# Patient Record
Sex: Female | Born: 1955 | Race: White | Hispanic: No | Marital: Married | Smoking: Never smoker
Health system: Southern US, Community
[De-identification: ages and names within clinical notes are randomized; demographics above are authoritative.]

## PROBLEM LIST (undated history)

## (undated) DIAGNOSIS — U071 COVID-19: Secondary | ICD-10-CM

## (undated) DIAGNOSIS — I10 Essential (primary) hypertension: Secondary | ICD-10-CM

## (undated) DIAGNOSIS — G8929 Other chronic pain: Secondary | ICD-10-CM

## (undated) DIAGNOSIS — M545 Low back pain, unspecified: Secondary | ICD-10-CM

## (undated) HISTORY — DX: Low back pain: M54.5

## (undated) HISTORY — DX: Essential (primary) hypertension: I10

## (undated) HISTORY — DX: Other chronic pain: G89.29

## (undated) HISTORY — DX: Low back pain, unspecified: M54.50

---

## 1999-02-24 ENCOUNTER — Encounter: Payer: Self-pay | Admitting: *Deleted

## 1999-02-24 ENCOUNTER — Ambulatory Visit (HOSPITAL_COMMUNITY): Admission: RE | Admit: 1999-02-24 | Discharge: 1999-02-24 | Payer: Self-pay | Admitting: *Deleted

## 2003-05-23 ENCOUNTER — Other Ambulatory Visit: Admission: RE | Admit: 2003-05-23 | Discharge: 2003-05-23 | Payer: Self-pay | Admitting: Family Medicine

## 2004-04-15 ENCOUNTER — Ambulatory Visit (HOSPITAL_COMMUNITY): Admission: RE | Admit: 2004-04-15 | Discharge: 2004-04-15 | Payer: Self-pay | Admitting: Family Medicine

## 2004-04-17 ENCOUNTER — Encounter: Admission: RE | Admit: 2004-04-17 | Discharge: 2004-04-17 | Payer: Self-pay | Admitting: Family Medicine

## 2005-04-14 ENCOUNTER — Other Ambulatory Visit: Admission: RE | Admit: 2005-04-14 | Discharge: 2005-04-14 | Payer: Self-pay | Admitting: Obstetrics and Gynecology

## 2005-06-30 ENCOUNTER — Encounter: Admission: RE | Admit: 2005-06-30 | Discharge: 2005-06-30 | Payer: Self-pay | Admitting: Obstetrics and Gynecology

## 2006-04-10 IMAGING — CR DG SHOULDER 2+V*R*
3 series · 3 of 3 positions shown · non-contrast
Comparison: None.
COMPARISON: None.

CLINICAL DATA: Right shoulder pain and right arm numbness ? No known injury. 
 CERVICAL SPINE COMPLETE ? 5 VIEWS:

[w shoulder ap internal righ *]
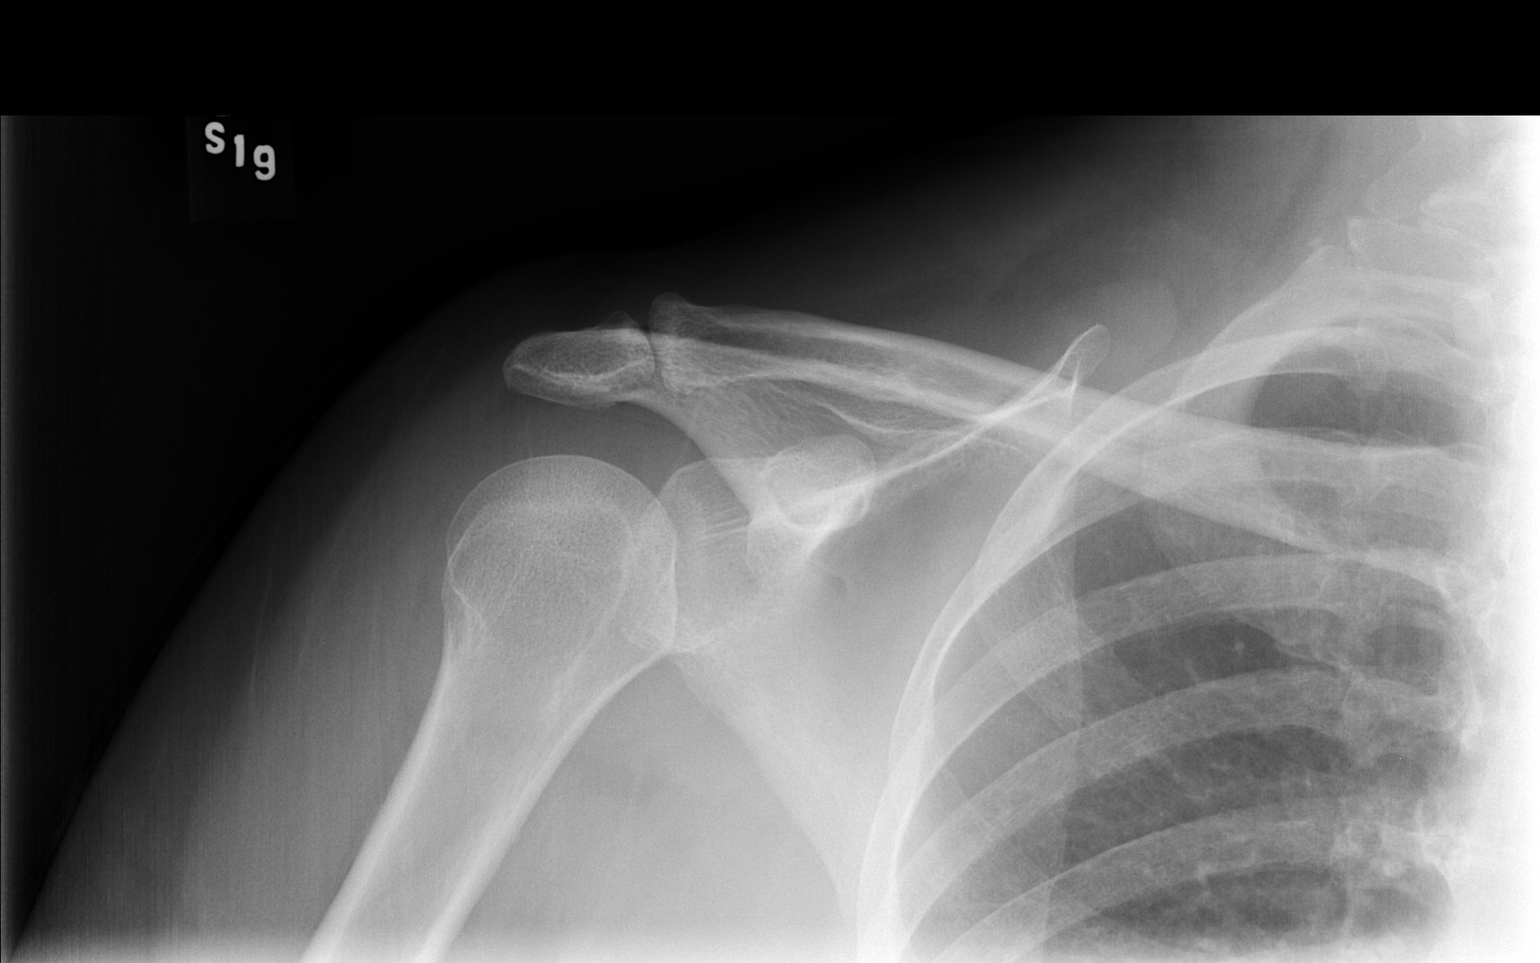

[w shoulder ap external righ *]
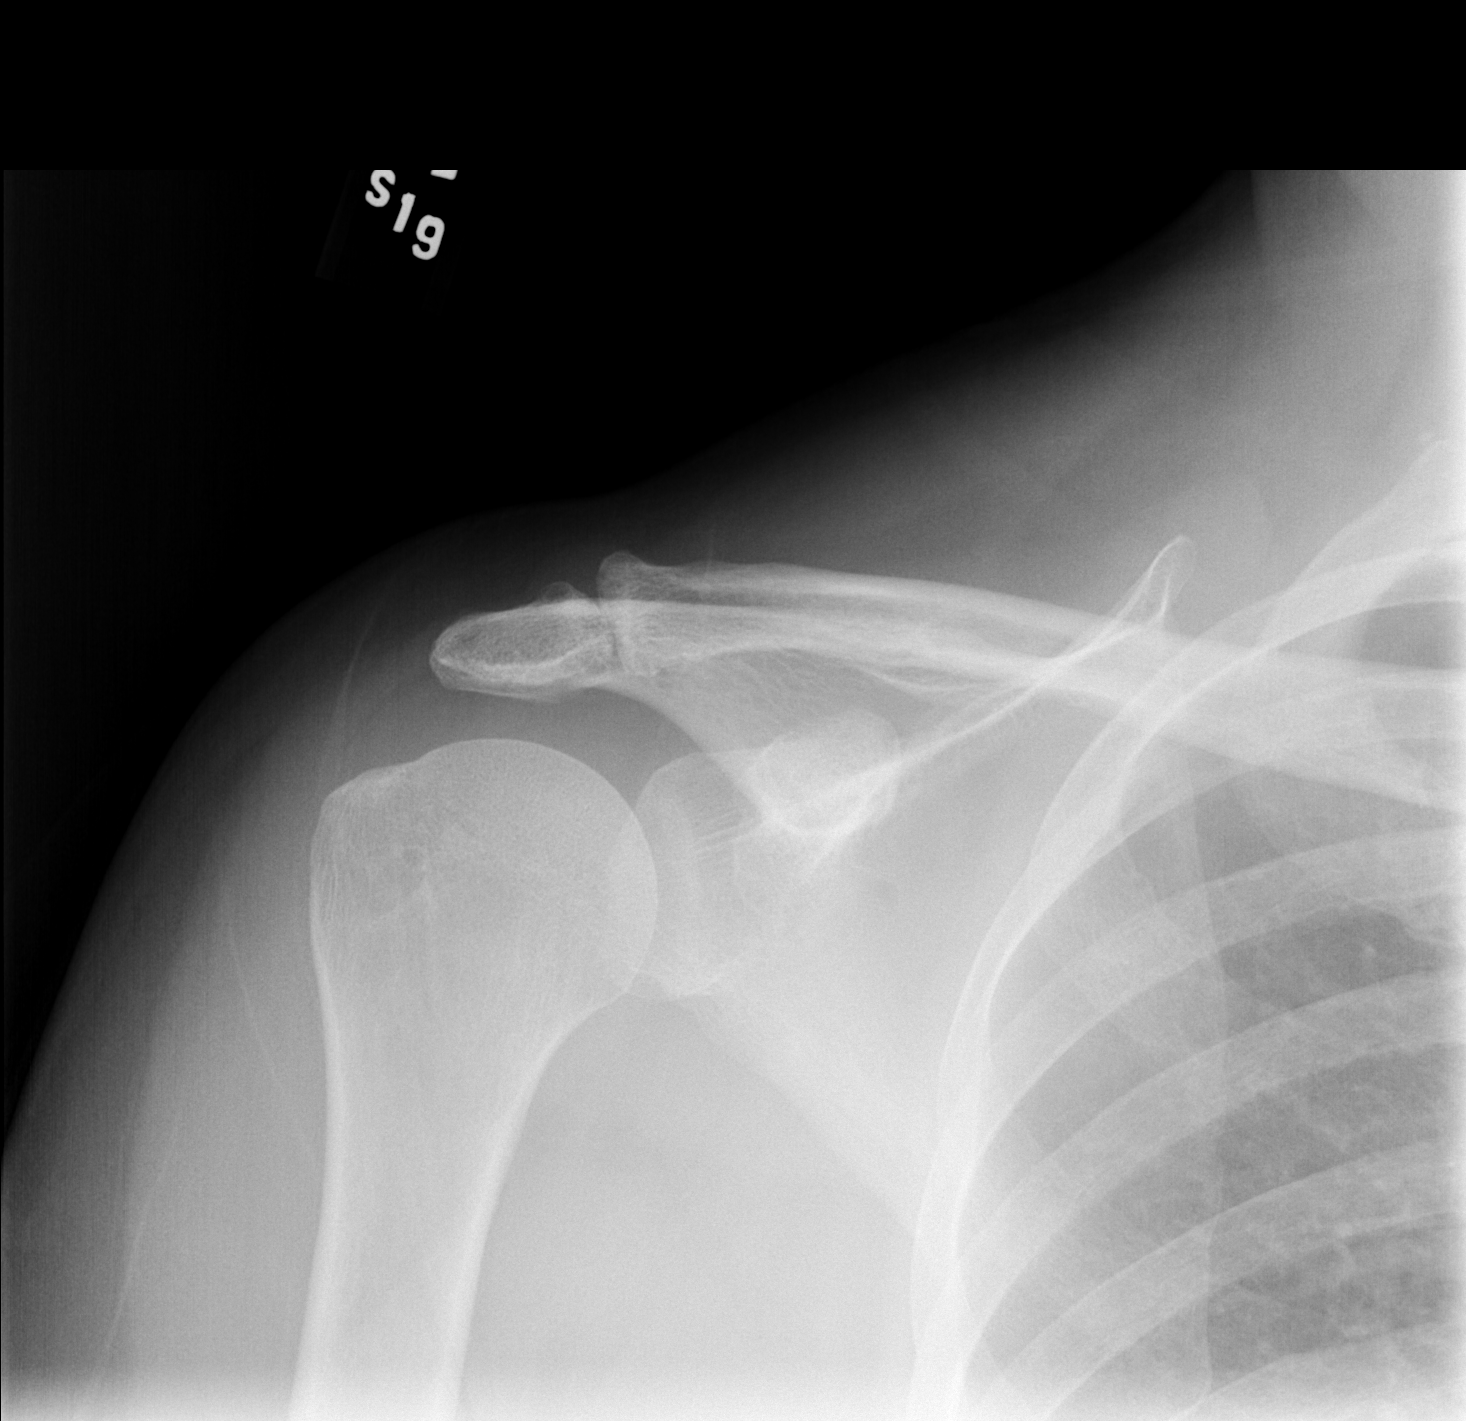

[w shoulder y view right *]
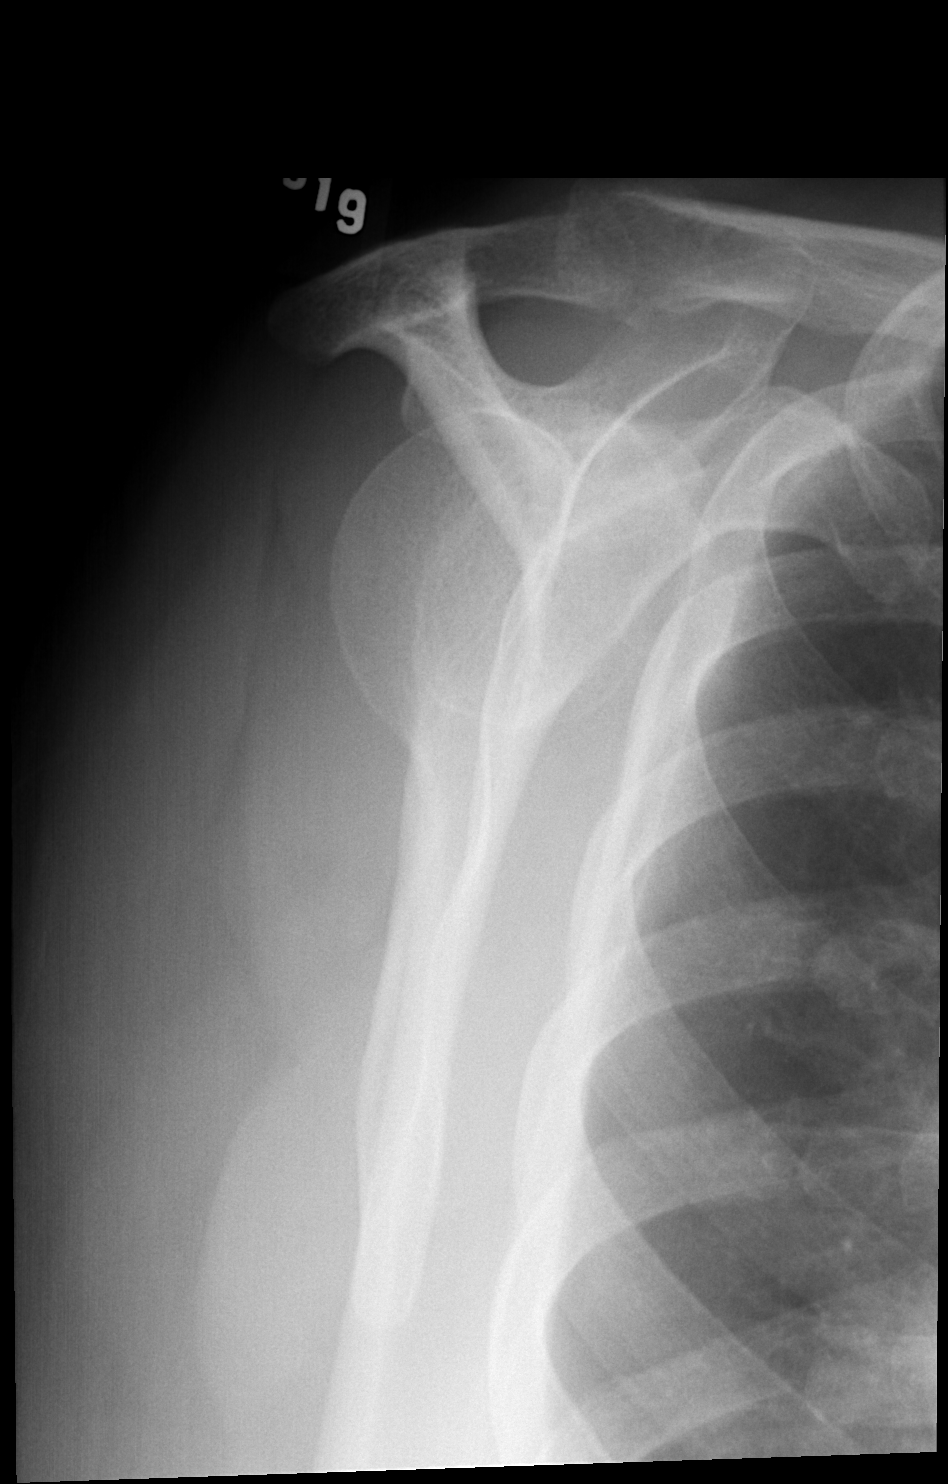

[3 of 3 positions shown; findings below may reference images not displayed]

FINDINGS: A routine four view study plus a lateral swimmer?s view of the cervicothoracic junction were done.
 There is disk narrowing at C6-7 and to a lesser degree at C5-6.  Mild biforaminal narrowing is noted at C5-6 and C6-7.  No acute changes.  Soft tissues are normal.
IMPRESSION: Degenerative disk disease at C5-6 and C6-7 ? no acute findings.
 RIGHT SHOULDER ? 3 VIEWS:
FINDINGS: Moderate degenerative changes of the acromioclavicular joints.  No acute bony abnormality.  No soft tissue calcifications.
IMPRESSION: Moderate degenerative changes of the right A/C joint ? no acute findings.

## 2010-02-21 ENCOUNTER — Encounter: Payer: Self-pay | Admitting: Family Medicine

## 2010-06-01 DEATH — deceased

## 2013-07-15 ENCOUNTER — Other Ambulatory Visit: Payer: Self-pay | Admitting: Orthopedic Surgery

## 2013-07-15 DIAGNOSIS — M25569 Pain in unspecified knee: Secondary | ICD-10-CM

## 2013-07-21 ENCOUNTER — Ambulatory Visit
Admission: RE | Admit: 2013-07-21 | Discharge: 2013-07-21 | Disposition: A | Discharge status: Home / Self Care | Payer: BC Managed Care – PPO | Source: Ambulatory Visit | Attending: Orthopedic Surgery | Admitting: Orthopedic Surgery

## 2013-07-21 DIAGNOSIS — M25569 Pain in unspecified knee: Secondary | ICD-10-CM

## 2013-12-03 ENCOUNTER — Other Ambulatory Visit: Payer: Self-pay | Admitting: Orthopedic Surgery

## 2013-12-03 DIAGNOSIS — M25561 Pain in right knee: Secondary | ICD-10-CM

## 2013-12-12 ENCOUNTER — Ambulatory Visit
Admission: RE | Admit: 2013-12-12 | Discharge: 2013-12-12 | Disposition: A | Discharge status: Home / Self Care | Payer: BC Managed Care – PPO | Source: Ambulatory Visit | Attending: Orthopedic Surgery | Admitting: Orthopedic Surgery

## 2013-12-12 DIAGNOSIS — M25561 Pain in right knee: Secondary | ICD-10-CM

## 2014-07-24 ENCOUNTER — Encounter (HOSPITAL_COMMUNITY): Payer: Self-pay | Admitting: *Deleted

## 2014-07-24 ENCOUNTER — Emergency Department (HOSPITAL_COMMUNITY)
Admission: EM | Admit: 2014-07-24 | Discharge: 2014-07-24 | Disposition: A | Discharge status: Home / Self Care | Payer: BLUE CROSS/BLUE SHIELD | Attending: Emergency Medicine | Admitting: Emergency Medicine

## 2014-07-24 DIAGNOSIS — D72829 Elevated white blood cell count, unspecified: Secondary | ICD-10-CM | POA: Diagnosis not present

## 2014-07-24 DIAGNOSIS — Z79899 Other long term (current) drug therapy: Secondary | ICD-10-CM | POA: Insufficient documentation

## 2014-07-24 DIAGNOSIS — L500 Allergic urticaria: Secondary | ICD-10-CM | POA: Insufficient documentation

## 2014-07-24 DIAGNOSIS — Y92007 Garden or yard of unspecified non-institutional (private) residence as the place of occurrence of the external cause: Secondary | ICD-10-CM | POA: Insufficient documentation

## 2014-07-24 DIAGNOSIS — T7840XA Allergy, unspecified, initial encounter: Secondary | ICD-10-CM | POA: Insufficient documentation

## 2014-07-24 DIAGNOSIS — Y998 Other external cause status: Secondary | ICD-10-CM | POA: Insufficient documentation

## 2014-07-24 DIAGNOSIS — S300XXA Contusion of lower back and pelvis, initial encounter: Secondary | ICD-10-CM | POA: Diagnosis not present

## 2014-07-24 DIAGNOSIS — R55 Syncope and collapse: Secondary | ICD-10-CM | POA: Diagnosis not present

## 2014-07-24 DIAGNOSIS — X58XXXA Exposure to other specified factors, initial encounter: Secondary | ICD-10-CM | POA: Diagnosis not present

## 2014-07-24 DIAGNOSIS — R21 Rash and other nonspecific skin eruption: Secondary | ICD-10-CM | POA: Diagnosis present

## 2014-07-24 DIAGNOSIS — Y9389 Activity, other specified: Secondary | ICD-10-CM | POA: Insufficient documentation

## 2014-07-24 LAB — COMPREHENSIVE METABOLIC PANEL
ALBUMIN: 3.7 g/dL (ref 3.5–5.0)
ALT: 24 U/L (ref 14–54)
ANION GAP: 10 (ref 5–15)
AST: 29 U/L (ref 15–41)
Alkaline Phosphatase: 93 U/L (ref 38–126)
BILIRUBIN TOTAL: 0.4 mg/dL (ref 0.3–1.2)
BUN: 10 mg/dL (ref 6–20)
CALCIUM: 8.6 mg/dL — AB (ref 8.9–10.3)
CHLORIDE: 97 mmol/L — AB (ref 101–111)
CO2: 24 mmol/L (ref 22–32)
CREATININE: 0.7 mg/dL (ref 0.44–1.00)
GFR calc Af Amer: >60 mL/min (ref >60)
GFR calc non Af Amer: >60 mL/min (ref >60)
Glucose, Bld: 151 mg/dL — ABNORMAL HIGH (ref 65–99)
Potassium: 3.3 mmol/L — ABNORMAL LOW (ref 3.5–5.1)
Sodium: 131 mmol/L — ABNORMAL LOW (ref 135–145)
TOTAL PROTEIN: 6.5 g/dL (ref 6.5–8.1)

## 2014-07-24 LAB — URINALYSIS, ROUTINE W REFLEX MICROSCOPIC
BILIRUBIN URINE: NEGATIVE
GLUCOSE, UA: NEGATIVE mg/dL
HGB URINE DIPSTICK: NEGATIVE
KETONES UR: NEGATIVE mg/dL
Nitrite: NEGATIVE
PROTEIN: NEGATIVE mg/dL
Specific Gravity, Urine: 1.005 (ref 1.005–1.030)
UROBILINOGEN UA: 0.2 mg/dL (ref 0.0–1.0)
pH: 6 (ref 5.0–8.0)

## 2014-07-24 LAB — CBC WITH DIFFERENTIAL/PLATELET
BASOS ABS: 0 10*3/uL (ref 0.0–0.1)
BASOS PCT: 0 % (ref 0–1)
EOS PCT: 0 % (ref 0–5)
Eosinophils Absolute: 0 10*3/uL (ref 0.0–0.7)
HEMATOCRIT: 40.5 % (ref 36.0–46.0)
Hemoglobin: 13.6 g/dL (ref 12.0–15.0)
Lymphocytes Relative: 11 % — ABNORMAL LOW (ref 12–46)
Lymphs Abs: 2.1 10*3/uL (ref 0.7–4.0)
MCH: 29.5 pg (ref 26.0–34.0)
MCHC: 33.6 g/dL (ref 30.0–36.0)
MCV: 87.9 fL (ref 78.0–100.0)
MONO ABS: 1 10*3/uL (ref 0.1–1.0)
Monocytes Relative: 5 % (ref 3–12)
Neutro Abs: 16 10*3/uL — ABNORMAL HIGH (ref 1.7–7.7)
Neutrophils Relative %: 84 % — ABNORMAL HIGH (ref 43–77)
Platelets: 342 10*3/uL (ref 150–400)
RBC: 4.61 MIL/uL (ref 3.87–5.11)
RDW: 12.7 % (ref 11.5–15.5)
WBC: 19.1 10*3/uL — ABNORMAL HIGH (ref 4.0–10.5)

## 2014-07-24 LAB — URINE MICROSCOPIC-ADD ON

## 2014-07-24 LAB — I-STAT TROPONIN, ED: Troponin i, poc: 0.01 ng/mL (ref 0.00–0.08)

## 2014-07-24 LAB — I-STAT CG4 LACTIC ACID, ED: Lactic Acid, Venous: 2.25 mmol/L (ref 0.5–2.0)

## 2014-07-24 MED ORDER — SODIUM CHLORIDE 0.9 % IV BOLUS (SEPSIS)
1000.0000 mL | Freq: Once | INTRAVENOUS | Status: AC
  Administered 2014-07-24: 1000 mL via INTRAVENOUS

## 2014-07-24 NOTE — Discharge Instructions (Signed)
Stay hydrated.   Take benadryl 25 mg every 6 hrs for itchiness.  Be careful that you will be drowsy after benadryl and may fall or pass out.   Follow up with your doctor.   Your white blood cell is elevated. Recheck in a week.   Return to ER if you have fever, worse rash, throat closing, trouble breathing, chest pain, back pain.

## 2014-07-24 NOTE — ED Notes (Signed)
Patient is resting comfortably. 

## 2014-07-24 NOTE — ED Notes (Signed)
Pt had syncopal episode while showering approx ago.  EMS reports pt has had hives and swelling in feet and hands since 2330 last evening - pt had been working in the garden and stated she thinks she was bitten by something.  Pt took 25mg  of benadryl PO and was given benadryl IV by EMS.  Pt denies SHOB, wheezing, CP.

## 2014-07-24 NOTE — ED Provider Notes (Signed)
CSN: [id number]     Arrival date & time 07/24/14  0116 History   First MD Initiated Contact with Patient 07/24/14 0124     Chief Complaint  Patient presents with  . Loss of Consciousness     (Consider location/radiation/quality/duration/timing/severity/associated sxs/prior Treatment) The history is provided by the patient.  Tiffany Soto is a 59 y.o. female history hypertension here presenting with possible allergic reaction, syncope. Patient was in the garden earlier today and states that she may have been bitten by something. She did take 25 mg of Benadryl. She then took a shower and then felt lightheaded dizzy and passed out and hit the faucet on the left side of her chest. Denies any head injury. She was noticed to have diffuse rash so given IV Benadryl by EMS. Denies any cardiac history and denies any early cardiac death in the family.     History reviewed. No pertinent past medical history. History reviewed. No pertinent past surgical history. No family history on file. History  Substance Use Topics  . Smoking status: Not on file  . Smokeless tobacco: Not on file  . Alcohol Use: Not on file   OB History    No data available     Review of Systems  Cardiovascular: Positive for syncope.  Skin: Positive for rash.  Neurological: Positive for syncope.  All other systems reviewed and are negative.     Allergies  Review of patient's allergies indicates no known allergies.  Home Medications   Prior to Admission medications   Medication Sig Start Date End Date Taking? Authorizing Provider  glucosamine-chondroitin 500-400 MG tablet Take 1 tablet by mouth daily.   Yes Historical Provider, MD  LISINOPRIL-HYDROCHLOROTHIAZIDE PO Take 1 tablet by mouth daily.   Yes Historical Provider, MD  Omega-3 Fatty Acids (FISH OIL PO) Take 1 tablet by mouth daily.   Yes Historical Provider, MD  vitamin B-12 (CYANOCOBALAMIN) 1000 MCG tablet Take 1,000 mcg by mouth daily.   Yes Historical  Provider, MD   BP 115/59 mmHg  Pulse 79  Temp(Src) 97.5 F (36.4 C) (Oral)  Resp 22  SpO2 97% Physical Exam  Constitutional: She is oriented to person, place, and time. She appears well-nourished.  Tired   HENT:  Head: Normocephalic.  MM slightly dry   Eyes: Conjunctivae are normal. Pupils are equal, round, and reactive to light.  Neck: Normal range of motion. Neck supple.  Cardiovascular: Normal rate and regular rhythm.   Pulmonary/Chest: Effort normal and breath sounds normal. No respiratory distress. She has no wheezes. She has no rales.  Bruise L side of back, nontender, no crepitance   Abdominal: Soft. Bowel sounds are normal. She exhibits no distension. There is no tenderness. There is no rebound.  Musculoskeletal: Normal range of motion. She exhibits no edema or tenderness.  Neurological: She is alert and oriented to person, place, and time.  Skin:  Faint urticaria on arm, torso.   Psychiatric: She has a normal mood and affect. Her behavior is normal. Judgment and thought content normal.  Nursing note and vitals reviewed.   ED Course  Procedures (including critical care time) Labs Review Labs Reviewed  CBC WITH DIFFERENTIAL/PLATELET - Abnormal; Notable for the following:    WBC 19.1 (*)    Neutrophils Relative % 84 (*)    Neutro Abs 16.0 (*)    Lymphocytes Relative 11 (*)    All other components within normal limits  COMPREHENSIVE METABOLIC PANEL - Abnormal; Notable for the following:  Sodium 131 (*)    Potassium 3.3 (*)    Chloride 97 (*)    Glucose, Bld 151 (*)    Calcium 8.6 (*)    All other components within normal limits  URINALYSIS, ROUTINE W REFLEX MICROSCOPIC (NOT AT [facility]) - Abnormal; Notable for the following:    Leukocytes, UA SMALL (*)    All other components within normal limits  I-STAT CG4 LACTIC ACID, ED - Abnormal; Notable for the following:    Lactic Acid, Venous 2.25 (*)    All other components within normal limits  URINE MICROSCOPIC-ADD ON   I-STAT TROPOININ, ED    Imaging Review No results found.   EKG Interpretation   Date/Time:  Thursday July 24 2014 01:41:13 EDT Ventricular Rate:  84 PR Interval:  147 QRS Duration: 98 QT Interval:  395 QTC Calculation: 467 R Axis:   11 Text Interpretation:  Sinus rhythm Low voltage, precordial leads  Borderline T abnormalities, anterior leads No previous ECGs available  Confirmed by Krisna Omar  MD, Quatavious Rossa ([id number]) on 07/24/2014 1:50:14 AM      MDM   Final diagnoses:  None    Tiffany Soto is a 59 y.o. female here with syncope, possible allergic reaction. She has a faint urticaria now, much improved per patient after benadryl. Syncope likely drowsiness after benadryl. Patient not orthostatic in the ED. Given IVF. Electrolytes at baseline. WBC 19, lactate 2.25, UA nl. No signs of sepsis. I think likely allergic reaction vs stress from falling. The bruise on L back is nontender, patient refused rib series but I doubt fracture. Ambulated in the ED and felt better. Will dc home.     Richardean Canal, MD 07/24/14 [phone]

## 2014-07-24 NOTE — ED Notes (Signed)
Bed: WA04 Expected date: 07/24/14 Expected time: 12:57 AM Means of arrival: Ambulance Comments: Syncopal episode

## 2014-07-24 NOTE — ED Notes (Signed)
Notified EDP,Yao,MD. Pt. i-stat lactic acid results 2.25.

## 2015-09-10 ENCOUNTER — Ambulatory Visit
Admission: RE | Admit: 2015-09-10 | Discharge: 2015-09-10 | Disposition: A | Discharge status: Home / Self Care | Source: Ambulatory Visit | Attending: Physician Assistant | Admitting: Physician Assistant

## 2015-09-10 ENCOUNTER — Other Ambulatory Visit: Payer: Self-pay | Admitting: Physician Assistant

## 2015-09-10 DIAGNOSIS — R059 Cough, unspecified: Secondary | ICD-10-CM

## 2015-09-10 DIAGNOSIS — R05 Cough: Secondary | ICD-10-CM

## 2015-09-29 ENCOUNTER — Ambulatory Visit (INDEPENDENT_AMBULATORY_CARE_PROVIDER_SITE_OTHER): Admitting: Allergy and Immunology

## 2015-09-29 ENCOUNTER — Encounter: Payer: Self-pay | Admitting: Allergy and Immunology

## 2015-09-29 VITALS — BP 118/80 | HR 84 | Resp 16

## 2015-09-29 DIAGNOSIS — J310 Chronic rhinitis: Secondary | ICD-10-CM | POA: Insufficient documentation

## 2015-09-29 DIAGNOSIS — R062 Wheezing: Secondary | ICD-10-CM | POA: Diagnosis not present

## 2015-09-29 DIAGNOSIS — Z889 Allergy status to unspecified drugs, medicaments and biological substances status: Secondary | ICD-10-CM | POA: Diagnosis not present

## 2015-09-29 MED ORDER — EPINEPHRINE 0.3 MG/0.3ML IJ SOAJ: INTRAMUSCULAR | 1 refills | Status: DC

## 2015-09-29 NOTE — Progress Notes (Signed)
    Follow-up Note  RE: Tiffany Soto MRN: [id number] DOB: 11/08/1955 Date of Office Visit: 09/29/2015  Primary care provider: No primary care provider on file. Referring provider: No ref. provider found  History of present illness: Tiffany Soto is a 60 y.o. 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  She was last seen in this clinic in September 2016.  She has had no additional allergic reactions in the interval since her previous visit.  She has no nasal symptom complaints today.  She reports that last week she began to experience "deep coughing, a "strange feeling"in her chest, and fatigue.  She went to her primary care physician and wheezing was auscultated on examination.  She was xray diagnosed with pneumonia last week and was started on azithromycin with significant symptom reduction.  She states that she still experiences some occasional coughing but overall feels well.  She reports that she had been given a prescription for albuterol HFA earlier in the year for intermittent wheezing associated with rest or tract infections.   Assessment and plan: History of allergic reaction No recurrence.  Her history had suggested Asian needle ant sting.  I have encouraged yearly evaluation of her home and property by a qualified exterminator.  Continue to have access to epinephrine autoinjector 2 pack.  A refill prescription has been provided.  Chronic rhinitis Stable.  Over-the-counter nasal steroid spray as needed.  Nasal saline spray (i.e., Simply Saline) or nasal saline lavage (i.e., NeilMed) as needed prior to medicated nasal sprays.  Wheezing  Continue ProAir HFA, 1-2 inhalations every 4-6 hours as needed.   No orders of the defined types were placed in this encounter.   Diagnositics: Spirometry reveals FVC of 1.97 L and FEV1 was 1.76 L.  Mild restriction, possibly secondary to body habitus.  Please see scanned spirometry  results for details.    Physical examination: Blood pressure 118/80, pulse 84, resp. rate 16.  General: Alert, interactive, in no acute distress. HEENT: TMs pearly gray, turbinates mildly edematous without discharge, post-pharynx mildly erythematous. Neck: Supple without lymphadenopathy. Lungs: Clear to auscultation without wheezing, rhonchi or rales. CV: Normal S1, S2 without murmurs. Skin: Warm and dry, without lesions or rashes.  The following portions of the patient's history were reviewed and updated as appropriate: allergies, current medications, past family history, past medical history, past social history, past surgical history and problem list.    Medication List       Accurate as of 09/29/15  3:42 PM. Always use your most recent med list.          EPINEPHrine 0.3 mg/0.3 mL Soaj injection Commonly known as:  EPI-PEN   FISH OIL PO Take 1 tablet by mouth daily.   glucosamine-chondroitin 500-400 MG tablet Take 1 tablet by mouth daily.   lisinopril-hydrochlorothiazide 20-12.5 MG tablet Commonly known as:  PRINZIDE,ZESTORETIC TK 1/2 T PO ONCE D   PROAIR HFA 108 (90 Base) MCG/ACT inhaler Generic drug:  albuterol Inhale into the lungs every 6 (six) hours as needed for wheezing or shortness of breath.       No Known Allergies  I appreciate the opportunity to take part in Amy's care. Please do not hesitate to contact me with questions.  Sincerely,   R. Jorene Guestarter Mathieu Schloemer, MD

## 2015-09-29 NOTE — Assessment & Plan Note (Signed)
No recurrence.  Her history had suggested Asian needle ant sting.  I have encouraged yearly evaluation of her home and property by a qualified exterminator.  Continue to have access to epinephrine autoinjector 2 pack.  A refill prescription has been provided.

## 2015-09-29 NOTE — Patient Instructions (Signed)
History of allergic reaction No recurrence.  Her history had suggested Asian needle ant sting.  I have encouraged yearly evaluation of her home and property by a qualified exterminator.  Continue to have access to epinephrine autoinjector 2 pack.  A refill prescription has been provided.  Chronic rhinitis Stable.  Over-the-counter nasal steroid spray as needed.  Nasal saline spray (i.e., Simply Saline) or nasal saline lavage (i.e., NeilMed) as needed prior to medicated nasal sprays.  Wheezing  Continue ProAir HFA, 1-2 inhalations every 4-6 hours as needed.   Return in about 1 year (around 09/28/2016), or if symptoms worsen or fail to improve.

## 2015-09-29 NOTE — Assessment & Plan Note (Signed)
   Continue ProAir HFA, 1-2 inhalations every 4-6 hours as needed.

## 2015-09-29 NOTE — Assessment & Plan Note (Signed)
Stable.  Over-the-counter nasal steroid spray as needed.  Nasal saline spray (i.e., Simply Saline) or nasal saline lavage (i.e., NeilMed) as needed prior to medicated nasal sprays.

## 2016-12-28 ENCOUNTER — Telehealth: Payer: Self-pay | Admitting: Cardiology

## 2016-12-28 NOTE — Telephone Encounter (Signed)
Received incoming records from [facility] for upcoming appointment on 01/02/17 @ 2:30pm with Corine ShelterLuke Kilroy, PA. [facility]

## 2017-01-02 ENCOUNTER — Ambulatory Visit (INDEPENDENT_AMBULATORY_CARE_PROVIDER_SITE_OTHER): Admitting: Cardiology

## 2017-01-02 ENCOUNTER — Encounter: Payer: Self-pay | Admitting: Cardiology

## 2017-01-02 VITALS — BP 128/80 | HR 68 | Ht 63.0 in | Wt 196.0 lb

## 2017-01-02 DIAGNOSIS — I10 Essential (primary) hypertension: Secondary | ICD-10-CM

## 2017-01-02 DIAGNOSIS — E66811 Obesity, class 1: Secondary | ICD-10-CM

## 2017-01-02 DIAGNOSIS — R079 Chest pain, unspecified: Secondary | ICD-10-CM

## 2017-01-02 DIAGNOSIS — R072 Precordial pain: Secondary | ICD-10-CM | POA: Diagnosis not present

## 2017-01-02 DIAGNOSIS — R000 Tachycardia, unspecified: Secondary | ICD-10-CM | POA: Insufficient documentation

## 2017-01-02 DIAGNOSIS — E669 Obesity, unspecified: Secondary | ICD-10-CM | POA: Diagnosis not present

## 2017-01-02 DIAGNOSIS — M1712 Unilateral primary osteoarthritis, left knee: Secondary | ICD-10-CM

## 2017-01-02 DIAGNOSIS — M179 Osteoarthritis of knee, unspecified: Secondary | ICD-10-CM | POA: Insufficient documentation

## 2017-01-02 DIAGNOSIS — M1710 Unilateral primary osteoarthritis, unspecified knee: Secondary | ICD-10-CM | POA: Insufficient documentation

## 2017-01-02 NOTE — Patient Instructions (Signed)
Schedule Treadmill   Schedule 48 hour holter monitor    Your physician recommends that you schedule a follow-up appointment with Dr.Croitoru after test

## 2017-01-02 NOTE — Assessment & Plan Note (Signed)
No symptoms to suggest sleep apnea 

## 2017-01-02 NOTE — Assessment & Plan Note (Signed)
Controlled.  

## 2017-01-02 NOTE — Assessment & Plan Note (Addendum)
Pt went to the ED at [facility] on Thanksgiving after she developed sudden SSCP with tachycardia- Troponin and CTA there negative

## 2017-01-02 NOTE — Assessment & Plan Note (Addendum)
Pt noted her HR was "129" on her Fit Bit during the above episode. Multiple EKGs reportedly done in the ED but no arrhythmia documented. She was unable to tell her HR was fast

## 2017-01-02 NOTE — Progress Notes (Signed)
01/02/2017 Tiffany Soto   07/12/1955  [id number]  Primary Physician Scifres, Peter Miniumorothy, PA-C Primary Cardiologist: Dr Royann Shiversroitoru- new  HPI:  Pleasant 61 y/o female here with her husband for evaluation of an episode of SSCP and tachycardia over the Thanksgiving Holiday. The pt has no history of tachycardia or chest pain. She does have a history of HTN. She was pretty active until around 2015 when she injured her Lt knee and had surgery. She had been under a fair amount of stress around the Holiday's and cooked dinner for the family. After she ate she got up and went to the kitchen and had sudden epigastric pain that was fairly severe for a few seconds then more of an ache. She noted her HR was 129 on her Fit Bit watch. She went back to the table and her daughter noted she was pale, not diaphoretic. EMS was called. At the hospital she had three EKGs because her HR was fast. She had a chest CTA (tachycardia and chest pain) that was negative for PE, no other info available. She was given IVF and sent home. She has not had similar symptoms in the past and has not had any recurrent symptoms since that episode.    Current Outpatient Medications  Medication Sig Dispense Refill  . albuterol (PROAIR HFA) 108 (90 Base) MCG/ACT inhaler Inhale into the lungs every 6 (six) hours as needed for wheezing or shortness of breath.    . EPINEPHrine 0.3 mg/0.3 mL IJ SOAJ injection Use as directed for severe allergic reaction 2 Device 1  . lisinopril-hydrochlorothiazide (PRINZIDE,ZESTORETIC) 20-12.5 MG tablet TK 1/2 T PO ONCE D  1   No current facility-administered medications for this visit.     No Known Allergies  Past Medical History:  Diagnosis Date  . Chronic lower back pain   . Hypertension     Social History   Socioeconomic History  . Marital status: Married    Spouse name: Not on file  . Number of children: Not on file  . Years of education: Not on file  . Highest education level: Not on file    Social Needs  . Financial resource strain: Not on file  . Food insecurity - worry: Not on file  . Food insecurity - inability: Not on file  . Transportation needs - medical: Not on file  . Transportation needs - non-medical: Not on file  Occupational History  . Not on file  Tobacco Use  . Smoking status: Never Smoker  . Smokeless tobacco: Never Used  Substance and Sexual Activity  . Alcohol use: No    Frequency: Never  . Drug use: No  . Sexual activity: Not on file  Other Topics Concern  . Not on file  Social History Narrative  . Not on file     Family History  Problem Relation Age of Onset  . Hypertension Mother   . Pneumonia Father      Review of Systems: General: negative for chills, fever, night sweats or weight changes.  Cardiovascular: negative for chest pain, dyspnea on exertion, edema, orthopnea, palpitations, paroxysmal nocturnal dyspnea or shortness of breath Dermatological: negative for rash Respiratory: negative for cough or wheezing Urologic: negative for hematuria Abdominal: negative for nausea, vomiting, diarrhea, bright red blood per rectum, melena, or hematemesis Neurologic: negative for visual changes, syncope, or dizziness All other systems reviewed and are otherwise negative except as noted above.    Blood pressure 128/80, pulse 68, height 5\' 3"  (1.6 m),  weight 196 lb (88.9 kg).  General appearance: alert, cooperative, no distress, moderately obese and looks younger than stated age Neck: no carotid bruit and no JVD Lungs: clear to auscultation bilaterally Heart: regular rate and rhythm and soft short systolic murmur AOV, LSB Abdomen: soft, non-tender; bowel sounds normal; no masses,  no organomegaly Extremities: extremities normal, atraumatic, no cyanosis or edema Pulses: 2+ and symmetric Skin: Skin color, texture, turgor normal. No rashes or lesions Neurologic: Grossly normal  EKG NSR  ASSESSMENT AND PLAN:   Chest pain   Pt went to the  ED at [facility] on Thanksgiving after she developed sudden SSCP with tachycardia- Troponin and CTA there negative  Tachycardia Pt noted her HR was "129" on her Fit Bit during the above episode. Multiple EKGs reportedly done in the ED but no arrhythmia documented. She was unable to tell her HR was fast  Essential hypertension Controlled-   Obesity (BMI 30.0-34.9) No symptoms to suggest sleep apnea  DJD (degenerative joint disease) of knee Pt was active until 2015 when she injured her Lt knee and had surgery. She did not resume exercising because she was afraid it would happen again   PLAN  Discussed with Dr Royann Shiversroitoru- check 48 hr Holter and POET. F/U with Dr Royann Shiversroitoru after these test.  Tiffany ShelterLuke Crystalmarie Yasin PA-C 01/02/2017 3:37 PM

## 2017-01-02 NOTE — Assessment & Plan Note (Signed)
Pt was active until 2015 when she injured her Lt knee and had surgery. She did not resume exercising because she was afraid it would happen again

## 2017-01-03 ENCOUNTER — Telehealth (HOSPITAL_COMMUNITY): Payer: Self-pay

## 2017-01-03 NOTE — Telephone Encounter (Signed)
Close encounter 

## 2017-01-05 ENCOUNTER — Ambulatory Visit (HOSPITAL_COMMUNITY)
Admission: RE | Admit: 2017-01-05 | Discharge: 2017-01-05 | Disposition: A | Discharge status: Home / Self Care | Source: Ambulatory Visit | Attending: Cardiology | Admitting: Cardiology

## 2017-01-05 DIAGNOSIS — R072 Precordial pain: Secondary | ICD-10-CM | POA: Diagnosis not present

## 2017-01-05 LAB — EXERCISE TOLERANCE TEST
Estimated workload: 6.1 METS
Exercise duration (min): 4 min
Exercise duration (sec): 17 s
MPHR: 157 {beats}/min
Peak HR: 160 {beats}/min
Percent HR: 101 %
RPE: 19
Rest HR: 89 {beats}/min

## 2017-01-13 ENCOUNTER — Other Ambulatory Visit: Payer: Self-pay | Admitting: Cardiology

## 2017-01-13 DIAGNOSIS — R079 Chest pain, unspecified: Secondary | ICD-10-CM

## 2017-01-13 DIAGNOSIS — R000 Tachycardia, unspecified: Secondary | ICD-10-CM

## 2017-01-16 ENCOUNTER — Ambulatory Visit (INDEPENDENT_AMBULATORY_CARE_PROVIDER_SITE_OTHER)

## 2017-01-16 DIAGNOSIS — R079 Chest pain, unspecified: Secondary | ICD-10-CM | POA: Diagnosis not present

## 2017-01-16 DIAGNOSIS — R000 Tachycardia, unspecified: Secondary | ICD-10-CM | POA: Diagnosis not present

## 2017-01-25 ENCOUNTER — Encounter: Payer: Self-pay | Admitting: Cardiology

## 2017-01-25 ENCOUNTER — Ambulatory Visit (INDEPENDENT_AMBULATORY_CARE_PROVIDER_SITE_OTHER): Admitting: Cardiology

## 2017-01-25 DIAGNOSIS — E669 Obesity, unspecified: Secondary | ICD-10-CM

## 2017-01-25 DIAGNOSIS — I10 Essential (primary) hypertension: Secondary | ICD-10-CM | POA: Diagnosis not present

## 2017-01-25 DIAGNOSIS — M1712 Unilateral primary osteoarthritis, left knee: Secondary | ICD-10-CM | POA: Diagnosis not present

## 2017-01-25 DIAGNOSIS — R079 Chest pain, unspecified: Secondary | ICD-10-CM | POA: Diagnosis not present

## 2017-01-25 NOTE — Assessment & Plan Note (Signed)
120/68 with large cuff

## 2017-01-25 NOTE — Progress Notes (Signed)
01/25/2017 Tiffany Soto   06/05/1955  [id number]  Primary Physician Scifres, Peter Miniumorothy, PA-C Primary Cardiologist: Dr Royann Shiversroitoru  HPI:  61 y/o female here for follow up. She was seen for evaluation of an episode of SSCP and tachycardia over the Thanksgiving Holiday. The pt has no prior history of tachycardia or chest pain. She has treated HTN. She was pretty active until around 2015 when she injured her Lt knee and had surgery. She has been afraid to exercise since. She had been under a fair amount of stress around the Holiday's and cooked dinner for the family. After she ate she got up and went to the kitchen and had sudden epigastric pain that was fairly severe for a few seconds then more of an ache. She noted her HR was 129 on her Fit Bit watch. She went back to the table and her daughter noted she was pale, not diaphoretic. EMS was called. And [facility] was taken to [facility]. At the hospital she had three EKGs because her HR was fast. She had a chest CTA (tachycardia and chest pain) that was negative for PE, no other info available. She was given IVF and sent home. She has not had similar symptoms in the past and has not had any recurrent symptoms since that episode. A POET and 48 hr Holter were obtained and were unremarkable. She has not had recurrent symptoms.     Current Outpatient Medications  Medication Sig Dispense Refill  . albuterol (PROAIR HFA) 108 (90 Base) MCG/ACT inhaler Inhale into the lungs every 6 (six) hours as needed for wheezing or shortness of breath.    . EPINEPHrine 0.3 mg/0.3 mL IJ SOAJ injection Use as directed for severe allergic reaction 2 Device 1  . lisinopril-hydrochlorothiazide (PRINZIDE,ZESTORETIC) 20-12.5 MG tablet TK 1/2 T PO ONCE D  1   No current facility-administered medications for this visit.     No Known Allergies  Past Medical History:  Diagnosis Date  . Chronic lower back pain   . Hypertension     Social History   Socioeconomic History  .  Marital status: Married    Spouse name: Not on file  . Number of children: Not on file  . Years of education: Not on file  . Highest education level: Not on file  Social Needs  . Financial resource strain: Not on file  . Food insecurity - worry: Not on file  . Food insecurity - inability: Not on file  . Transportation needs - medical: Not on file  . Transportation needs - non-medical: Not on file  Occupational History  . Not on file  Tobacco Use  . Smoking status: Never Smoker  . Smokeless tobacco: Never Used  Substance and Sexual Activity  . Alcohol use: No    Frequency: Never  . Drug use: No  . Sexual activity: Not on file  Other Topics Concern  . Not on file  Social History Narrative  . Not on file     Family History  Problem Relation Age of Onset  . Hypertension Mother   . Pneumonia Father      Review of Systems: General: negative for chills, fever, night sweats or weight changes.  Cardiovascular: negative for chest pain, dyspnea on exertion, edema, orthopnea, palpitations, paroxysmal nocturnal dyspnea or shortness of breath Dermatological: negative for rash Respiratory: negative for cough or wheezing Urologic: negative for hematuria Abdominal: negative for nausea, vomiting, diarrhea, bright red blood per rectum, melena, or hematemesis Neurologic: negative for  visual changes, syncope, or dizziness All other systems reviewed and are otherwise negative except as noted above.    Blood pressure (!) 145/91, pulse 74, height 5\' 3"  (1.6 m), weight 197 lb (89.4 kg), SpO2 98 %.  General appearance: alert, cooperative and no distress Neck: no carotid bruit and no JVD Lungs: clear to auscultation bilaterally Heart: regular rate and rhythm Extremities: no edema Skin: Skin color, texture, turgor normal. No rashes or lesions Neurologic: Grossly normal   ASSESSMENT AND PLAN:   Chest pain Pt went to the ED at [facility] on Thanksgiving after she developed sudden SSCP  with tachycardia. POET and 48 Hr Holter unremarkable except for ectopy  Obesity (BMI 30.0-34.9) No evidence of sleep apnea by history  DJD (degenerative joint disease) of knee Knee surgery in May 2018. She has been reluctant to start exercise because of this  Essential hypertension 120/68 with large cuff   PLAN  I reviewed the pt's Holter and POET with her. No change in Rx. We discussed starting a walking program and she says she has joined [employer]. We will be happy to see her in follow up as needed. She knows to contact us if she has recurrent symptoms or concerns. Dr Royann Shiversroitoru saw her with me as well today.   Corine ShelterLuke Anahis Furgeson PA-C 01/25/2017 10:35 AM

## 2017-01-25 NOTE — Assessment & Plan Note (Signed)
No evidence of sleep apnea by history

## 2017-01-25 NOTE — Assessment & Plan Note (Signed)
Knee surgery in May 2018. She has been reluctant to start exercise because of this

## 2017-01-25 NOTE — Patient Instructions (Signed)
Medication Instructions: Your physician recommends that you continue on your current medications as directed. Please refer to the Current Medication list given to you today.  If you need a refill on your cardiac medications before your next appointment, please call your pharmacy.    Follow-Up: Your physician wants you to follow-up in: as needed with Corine ShelterLuke Kilroy, PA. You will receive a reminder letter in the mail two months in advance. If you don't receive a letter, please call our office at [phone] to schedule this follow-up appointment.   Thank you for choosing [facility]!!

## 2017-01-25 NOTE — Assessment & Plan Note (Signed)
Pt went to the ED at [facility] on Thanksgiving after she developed sudden SSCP with tachycardia. POET and 48 Hr Holter unremarkable except for ectopy

## 2017-05-09 ENCOUNTER — Telehealth: Payer: Self-pay | Admitting: Allergy and Immunology

## 2017-05-09 ENCOUNTER — Other Ambulatory Visit: Payer: Self-pay

## 2017-05-09 NOTE — Telephone Encounter (Signed)
Patient called requesting a refill on her Epi Pen and a nasal spray? Last seen 09-29-15. I advised she needed an appointment. She made one for 05-29-17. [facility].

## 2017-05-10 NOTE — Telephone Encounter (Signed)
Left message for patient advising her that we would be unable to send in refill until she was seen in office.

## 2017-05-29 ENCOUNTER — Encounter: Payer: Self-pay | Admitting: Allergy and Immunology

## 2017-05-29 ENCOUNTER — Ambulatory Visit (INDEPENDENT_AMBULATORY_CARE_PROVIDER_SITE_OTHER): Admitting: Allergy and Immunology

## 2017-05-29 VITALS — BP 166/84 | HR 76 | Resp 20 | Ht 63.0 in | Wt 189.0 lb

## 2017-05-29 DIAGNOSIS — Z889 Allergy status to unspecified drugs, medicaments and biological substances status: Secondary | ICD-10-CM | POA: Diagnosis not present

## 2017-05-29 DIAGNOSIS — R062 Wheezing: Secondary | ICD-10-CM

## 2017-05-29 DIAGNOSIS — J310 Chronic rhinitis: Secondary | ICD-10-CM

## 2017-05-29 MED ORDER — ALBUTEROL SULFATE HFA 108 (90 BASE) MCG/ACT IN AERS: 1.0000 | INHALATION_SPRAY | Freq: Four times a day (QID) | RESPIRATORY_TRACT | 2 refills | Status: DC | PRN

## 2017-05-29 MED ORDER — EPINEPHRINE 0.3 MG/0.3ML IJ SOAJ: INTRAMUSCULAR | 1 refills | Status: AC

## 2017-05-29 NOTE — Assessment & Plan Note (Signed)
   Continue over-the-counter nasal steroid spray as needed.  Nasal saline spray (i.e., Simply Saline) or nasal saline lavage (i.e., NeilMed) as needed prior to medicated nasal sprays.

## 2017-05-29 NOTE — Progress Notes (Signed)
Follow-up Note  RE: Tiffany Soto MRN: [id number] DOB: 09/22/1955 Date of Office Visit: 05/29/2017  Primary care provider: Scifres, Peter Minium Referring provider: Scifres, Nicole Cella, PA-C  History of present illness: Tiffany Soto is a 62 y.o. female with a history of allergic reaction chronic rhinitis, and history of wheezing presenting today for follow-up.  She was last seen in this clinic in August 2017.  She reports that in the interval since her previous visit she has not been bitten/stung by an ant and has had no further allergic reactions.  She needs an epinephrine autoinjector refill prescription.  She experiences wheezing on rare occasions, typically associated with upper respiratory tract infections.  She has no nasal symptom complaints today.   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  I have encouraged yearly evaluation of her home and property by a qualified exterminator.  Continue to have access to epinephrine autoinjector 2 pack.  A refill prescription has been provided.  A refill prescription has been provided for epinephrine 0.3 mg autoinjector 2 pack along with instructions for its proper administration.  Wheezing  Continue albuterol HFA, 1 to 2 inhalations every 6 hours if needed.  A refill prescription has been provided.  Chronic rhinitis  Continue over-the-counter nasal steroid spray as needed.  Nasal saline spray (i.e., Simply Saline) or nasal saline lavage (i.e., NeilMed) as needed prior to medicated nasal sprays.   Meds ordered this encounter  Medications  . EPINEPHrine 0.3 mg/0.3 mL IJ SOAJ injection    Sig: Use as directed for severe allergic reaction    Dispense:  2 Device    Refill:  1    Dispense Mylan generic only  . albuterol (PROAIR HFA) 108 (90 Base) MCG/ACT inhaler    Sig: Inhale 1-2 puffs into the lungs every 6 (six) hours as needed for wheezing or shortness of  breath.    Dispense:  1 Inhaler    Refill:  2    Diagnostics: Spirometry reveals an FVC of 2.40 L and an FEV1 of 1.90 L (80% predicted).  Mild restrictive pattern possibly secondary to body habitus.  Please see scanned spirometry results for details.    Physical examination: Blood pressure (!) 166/84, pulse 76, resp. rate 20, height  (1.6 m), weight 189 lb (85.7 kg), SpO2 98 %.  General: Alert, interactive, in no acute distress. HEENT: TMs pearly gray, turbinates mildly edematous without discharge, post-pharynx unremarkable. Neck: Supple without lymphadenopathy. Lungs: Clear to auscultation without wheezing, rhonchi or rales. CV: Normal S1, S2 without murmurs. Skin: Warm and dry, without lesions or rashes.  The following portions of the patient's history were reviewed and updated as appropriate: allergies, current medications, past family history, past medical history, past social history, past surgical history and problem list.  Allergies as of 05/29/2017   No Known Allergies     Medication List        Accurate as of 05/29/17 12:45 PM. Always use your most recent med list.          albuterol 108 (90 Base) MCG/ACT inhaler Commonly known as:  PROAIR HFA Inhale 1-2 puffs into the lungs every 6 (six) hours as needed for wheezing or shortness of breath.   EPINEPHrine 0.3 mg/0.3 mL Soaj injection Commonly known as:  EPI-PEN Use as directed for severe allergic reaction   lisinopril-hydrochlorothiazide 20-12.5 MG tablet Commonly known as:  PRINZIDE,ZESTORETIC TK 1/2 T PO ONCE D  No Known Allergies  I appreciate the opportunity to take part in Tiffany Soto's care. Please do not hesitate to contact me with questions.  Sincerely,   R. Jorene Guest, MD

## 2017-05-29 NOTE — Patient Instructions (Addendum)
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  I have encouraged yearly evaluation of her home and property by a qualified exterminator.  Continue to have access to epinephrine autoinjector 2 pack.  A refill prescription has been provided.  A refill prescription has been provided for epinephrine 0.3 mg autoinjector 2 pack along with instructions for its proper administration.  Wheezing  Continue albuterol HFA, 1 to 2 inhalations every 6 hours if needed.  A refill prescription has been provided.  Chronic rhinitis  Continue over-the-counter nasal steroid spray as needed.  Nasal saline spray (i.e., Simply Saline) or nasal saline lavage (i.e., NeilMed) as needed prior to medicated nasal sprays.

## 2017-05-29 NOTE — Assessment & Plan Note (Signed)
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  I have encouraged yearly evaluation of her home and property by a qualified exterminator.  Continue to have access to epinephrine autoinjector 2 pack.  A refill prescription has been provided.  A refill prescription has been provided for epinephrine 0.3 mg autoinjector 2 pack along with instructions for its proper administration.

## 2017-05-29 NOTE — Assessment & Plan Note (Signed)
   Continue albuterol HFA, 1 to 2 inhalations every 6 hours if needed.  A refill prescription has been provided.

## 2017-06-20 ENCOUNTER — Other Ambulatory Visit: Payer: Self-pay | Admitting: Allergy and Immunology

## 2018-05-18 ENCOUNTER — Encounter (HOSPITAL_COMMUNITY): Payer: Self-pay | Admitting: Pharmacy Technician

## 2018-05-18 ENCOUNTER — Other Ambulatory Visit: Payer: Self-pay

## 2018-05-18 ENCOUNTER — Emergency Department (HOSPITAL_COMMUNITY)
Admission: EM | Admit: 2018-05-18 | Discharge: 2018-05-19 | Disposition: A | Discharge status: Home / Self Care | Attending: Emergency Medicine | Admitting: Emergency Medicine

## 2018-05-18 ENCOUNTER — Emergency Department (HOSPITAL_COMMUNITY)

## 2018-05-18 DIAGNOSIS — I10 Essential (primary) hypertension: Secondary | ICD-10-CM | POA: Insufficient documentation

## 2018-05-18 DIAGNOSIS — R079 Chest pain, unspecified: Secondary | ICD-10-CM

## 2018-05-18 DIAGNOSIS — R0789 Other chest pain: Secondary | ICD-10-CM | POA: Insufficient documentation

## 2018-05-18 LAB — CBC
HCT: 41.8 % (ref 36.0–46.0)
Hemoglobin: 13.4 g/dL (ref 12.0–15.0)
MCH: 28.6 pg (ref 26.0–34.0)
MCHC: 32.1 g/dL (ref 30.0–36.0)
MCV: 89.3 fL (ref 80.0–100.0)
Platelets: 327 10*3/uL (ref 150–400)
RBC: 4.68 MIL/uL (ref 3.87–5.11)
RDW: 12.6 % (ref 11.5–15.5)
WBC: 8.2 10*3/uL (ref 4.0–10.5)
nRBC: 0 % (ref 0.0–0.2)

## 2018-05-18 LAB — BASIC METABOLIC PANEL
Anion gap: 12 (ref 5–15)
BUN: <5 mg/dL — ABNORMAL LOW (ref 8–23)
CO2: 21 mmol/L — ABNORMAL LOW (ref 22–32)
Calcium: 9.1 mg/dL (ref 8.9–10.3)
Chloride: 99 mmol/L (ref 98–111)
Creatinine, Ser: 0.61 mg/dL (ref 0.44–1.00)
GFR calc Af Amer: >60 mL/min (ref >60)
GFR calc non Af Amer: >60 mL/min (ref >60)
Glucose, Bld: 105 mg/dL — ABNORMAL HIGH (ref 70–99)
Potassium: 3.4 mmol/L — ABNORMAL LOW (ref 3.5–5.1)
Sodium: 132 mmol/L — ABNORMAL LOW (ref 135–145)

## 2018-05-18 LAB — TROPONIN I: Troponin I: <0.03 ng/mL (ref <0.03)

## 2018-05-18 MED ORDER — SODIUM CHLORIDE 0.9% FLUSH: 3.0000 mL | Freq: Once | INTRAVENOUS | Status: DC

## 2018-05-18 NOTE — ED Triage Notes (Signed)
Pt bib ems from home with reports of R sided chest pain approx 45 min ago. Denies radiation. Pt reports pain 3/10. 324mg  asa given en route. 176/90, ekg unremarkable, 98% RA, CBG 111.

## 2018-05-19 LAB — TROPONIN I: Troponin I: <0.03 ng/mL (ref <0.03)

## 2018-05-19 NOTE — Discharge Instructions (Addendum)
Your labs, chest x-ray and EKG are all reassuring, without acute findings tonight. You can be discharged home and will need to follow up with your doctor for recheck in the next one week. This may be done by phone or video appointment with the current pandemic. Return to the emergency department with any worsening symptoms or new concerns.

## 2018-05-19 NOTE — ED Notes (Signed)
Chest pain since 1430    She has had the same 2 years ago  When she arrived to ed her chest pain was gone  No pain now.   Alert oriented

## 2018-05-19 NOTE — ED Provider Notes (Signed)
[facility]   CSN: [id number] Arrival date & time: 05/18/18  1726    History   Chief Complaint Chief Complaint  Patient presents with  . Chest Pain    HPI Tiffany Soto is a 63 y.o. female.     Patient with h/o HTN presents with chest pain that started about 1 hour prior to arrival in the center of her chest. It then moved into the right lateral chest area before it resolved. No SOB, cough, fever, nausea, vomiting or diaphoresis. She reports history of chest pain in the past evaluated as an outpatient by holter monitor and stress test, with reported negative results. She has been pain free since arrival.  The history is provided by the patient. No language interpreter was used.  Chest Pain  Associated symptoms: no abdominal pain, no cough, no diaphoresis, no fever, no nausea, no shortness of breath, no vomiting and no weakness     Past Medical History:  Diagnosis Date  . Chronic lower back pain   . Hypertension     Patient Active Problem List   Diagnosis Date Noted  . Essential hypertension 01/25/2017  . Chest pain 01/02/2017  . Tachycardia 01/02/2017  . Obesity (BMI 30.0-34.9) 01/02/2017  . 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 History of allergic reaction 09/29/2015  . Chronic rhinitis 09/29/2015  . Wheezing 09/29/2015    History reviewed. No pertinent surgical history.   OB History   No obstetric history on file.      Home Medications    Prior to Admission medications   Medication Sig Start Date End Date Taking? Authorizing Provider  lisinopril-hydrochlorothiazide (PRINZIDE,ZESTORETIC) 20-12.5 MG tablet Take 0.5 tablets by mouth daily.  08/11/15  Yes [provider]  EPINEPHrine 0.3 mg/0.3 mL IJ SOAJ injection Use as directed for severe allergic reaction Patient not taking: Reported on 05/19/2018 05/29/17   Cristal FordBobbitt, Ralph Carter, MD  PROAIR HFA 108 [phone](90 Base) MCG/ACT inhaler  INHALE 1 TO 2 PUFFS INTO THE LUNGS EVERY 6 HOURS AS NEEDED FOR WHEEZING OR SHORTNESS OF BREATH Patient not taking: Reported on 05/19/2018 06/20/17   Bobbitt, Heywood Ilesalph Carter, MD    Family History Family History  Problem Relation Age of Onset  . Hypertension Mother   . Pneumonia Father     Social History Social History   Tobacco Use  . Smoking status: Never Smoker  . Smokeless tobacco: Never Used  Substance Use Topics  . Alcohol use: No    Frequency: Never  . Drug use: No     Allergies   Patient has no known allergies.   Review of Systems Review of Systems  Constitutional: Negative for chills, diaphoresis and fever.  HENT: Negative.   Respiratory: Negative.  Negative for cough and shortness of breath.   Cardiovascular: Positive for chest pain. Negative for leg swelling.  Gastrointestinal: Negative.  Negative for abdominal pain, nausea and vomiting.  Musculoskeletal: Negative.   Skin: Negative.   Neurological: Negative.  Negative for weakness and light-headedness.     Physical Exam Updated Vital Signs BP (!) 124/55   Pulse 85   Temp 98.4 F (36.9 C)   Resp 14   SpO2 96%   Physical Exam Vitals signs and nursing note reviewed.  Constitutional:      General: She is not in acute distress.    Appearance: She is well-developed.  HENT:     Head: Normocephalic.  Neck:     Musculoskeletal: Normal  range of motion and neck supple.  Cardiovascular:     Rate and Rhythm: Normal rate and regular rhythm.     Heart sounds: No murmur.  Pulmonary:     Effort: Pulmonary effort is normal.     Breath sounds: Normal breath sounds. No wheezing, rhonchi or rales.  Abdominal:     General: Bowel sounds are normal.     Palpations: Abdomen is soft.     Tenderness: There is no abdominal tenderness. There is no guarding or rebound.  Musculoskeletal: Normal range of motion.     Right lower leg: No edema.     Left lower leg: No edema.  Skin:    General: Skin is warm and dry.      Findings: No rash.  Neurological:     Mental Status: She is alert and oriented to person, place, and time.      ED Treatments / Results  Labs (all labs ordered are listed, but only abnormal results are displayed) Labs Reviewed  BASIC METABOLIC PANEL - Abnormal; Notable for the following components:      Result Value   Sodium 132 (*)    Potassium 3.4 (*)    CO2 21 (*)    Glucose, Bld 105 (*)    BUN <5 (*)    All other components within normal limits  CBC  TROPONIN I  TROPONIN I    EKG EKG Interpretation  Date/Time:  Saturday May 19 2018 00:23:07 EDT Ventricular Rate:  78 PR Interval:    QRS Duration: 124 QT Interval:  381 QTC Calculation: 434 R Axis:   42 Text Interpretation:  Sinus rhythm LAE, consider biatrial enlargement Nonspecific intraventricular conduction delay Interpretation limited secondary to artifact No significant change since last tracing Confirmed by Zadie Rhine ([id number]) on 05/19/2018 12:58:53 AM   Radiology Dg Chest 2 View  Result Date: 05/18/2018 CLINICAL DATA:  Chest pain EXAM: CHEST - 2 VIEW COMPARISON:  09/10/2015 FINDINGS: The heart size and mediastinal contours are within normal limits. Both lungs are clear. The visualized skeletal structures are unremarkable. IMPRESSION: No active cardiopulmonary disease. Electronically Signed   By: Elige Ko   On: 05/18/2018 18:14    Procedures Procedures (including critical care time)  Medications Ordered in ED Medications  sodium chloride flush (NS) 0.9 % injection 3 mL (has no administration in time range)     Initial Impression / Assessment and Plan / ED Course  I have reviewed the triage vital signs and the nursing notes.  Pertinent labs & imaging results that were available during my care of the patient were reviewed by me and considered in my medical decision making (see chart for details).        Patient to ED with c/o chest pain that started one hour prior to coming to the hospital.  No pain since. No associated SOB, cough, diaphoresis, nausea. History of chest pain 2 years ago evaluated in outpatient setting by cardiology. Negative results on chart review.   She has remained painfree since arrival. She has been sleeping on multiple rechecks and appears comfortable. VSS.   Labs, including delta trop, CXR, EKG all without acute findings. She is felt appropriate for discharge home. She has been seen and evaluated by Dr. Bebe Shaggy who agrees with plan of care.    Final Clinical Impressions(s) / ED Diagnoses   Final diagnoses:  None   1. Nonspecific chest pain  ED Discharge Orders    None       Rhonna Holster,  Melvenia Beam, PA-C 05/19/18 1610    Zadie Rhine, MD 05/19/18 [phone]

## 2018-10-31 ENCOUNTER — Other Ambulatory Visit: Payer: Self-pay

## 2018-10-31 DIAGNOSIS — Z20822 Contact with and (suspected) exposure to covid-19: Secondary | ICD-10-CM

## 2018-11-01 LAB — NOVEL CORONAVIRUS, NAA: SARS-CoV-2, NAA: DETECTED — AB

## 2018-11-06 ENCOUNTER — Telehealth: Payer: Self-pay | Admitting: Physician Assistant

## 2018-11-06 NOTE — Telephone Encounter (Signed)
Pt is aware of her covid-19 results.

## 2018-12-10 DIAGNOSIS — Z23 Encounter for immunization: Secondary | ICD-10-CM | POA: Diagnosis not present

## 2018-12-10 DIAGNOSIS — I10 Essential (primary) hypertension: Secondary | ICD-10-CM | POA: Diagnosis not present

## 2018-12-24 DIAGNOSIS — H25043 Posterior subcapsular polar age-related cataract, bilateral: Secondary | ICD-10-CM | POA: Diagnosis not present

## 2018-12-24 DIAGNOSIS — H2513 Age-related nuclear cataract, bilateral: Secondary | ICD-10-CM | POA: Diagnosis not present

## 2018-12-24 DIAGNOSIS — I10 Essential (primary) hypertension: Secondary | ICD-10-CM | POA: Diagnosis not present

## 2018-12-24 DIAGNOSIS — H25013 Cortical age-related cataract, bilateral: Secondary | ICD-10-CM | POA: Diagnosis not present

## 2018-12-25 DIAGNOSIS — M545 Low back pain: Secondary | ICD-10-CM | POA: Diagnosis not present

## 2018-12-25 DIAGNOSIS — M25552 Pain in left hip: Secondary | ICD-10-CM | POA: Diagnosis not present

## 2019-01-16 DIAGNOSIS — M545 Low back pain: Secondary | ICD-10-CM | POA: Diagnosis not present

## 2019-01-17 DIAGNOSIS — H25041 Posterior subcapsular polar age-related cataract, right eye: Secondary | ICD-10-CM | POA: Diagnosis not present

## 2019-01-17 DIAGNOSIS — H25011 Cortical age-related cataract, right eye: Secondary | ICD-10-CM | POA: Diagnosis not present

## 2019-01-17 DIAGNOSIS — H2511 Age-related nuclear cataract, right eye: Secondary | ICD-10-CM | POA: Diagnosis not present

## 2019-01-18 DIAGNOSIS — H2512 Age-related nuclear cataract, left eye: Secondary | ICD-10-CM | POA: Diagnosis not present

## 2019-01-22 DIAGNOSIS — R1012 Left upper quadrant pain: Secondary | ICD-10-CM | POA: Diagnosis not present

## 2019-01-31 DIAGNOSIS — H25042 Posterior subcapsular polar age-related cataract, left eye: Secondary | ICD-10-CM | POA: Diagnosis not present

## 2019-01-31 DIAGNOSIS — H2512 Age-related nuclear cataract, left eye: Secondary | ICD-10-CM | POA: Diagnosis not present

## 2019-01-31 DIAGNOSIS — H25012 Cortical age-related cataract, left eye: Secondary | ICD-10-CM | POA: Diagnosis not present

## 2019-03-04 DIAGNOSIS — M546 Pain in thoracic spine: Secondary | ICD-10-CM | POA: Diagnosis not present

## 2019-03-04 DIAGNOSIS — M542 Cervicalgia: Secondary | ICD-10-CM | POA: Diagnosis not present

## 2019-03-28 ENCOUNTER — Ambulatory Visit: Payer: Medicare Other | Attending: Internal Medicine

## 2019-03-28 DIAGNOSIS — Z23 Encounter for immunization: Secondary | ICD-10-CM

## 2019-03-28 NOTE — Progress Notes (Signed)
   Covid-19 Vaccination Clinic  Name:  Tiffany Soto    MRN: [id number] DOB: May 27, 1955  03/28/2019  [name] was observed post Covid-19 immunization for 15 minutes without incidence. She was provided with Vaccine Information Sheet and instruction to access the V-Safe system.   [name] was instructed to call 911 with any severe reactions post vaccine: Marland Kitchen Difficulty breathing  . Swelling of your face and throat  . A fast heartbeat  . A bad rash all over your body  . Dizziness and weakness    Immunizations Administered    Name Date Dose VIS Date Route   Pfizer COVID-19 Vaccine 03/28/2019  2:41 PM 0.3 mL 01/11/2019 Intramuscular   Manufacturer: [employer], [employer]   Lot: [id number]   NDC: [address]-0

## 2019-04-23 ENCOUNTER — Ambulatory Visit: Payer: TRICARE For Life (TFL) | Attending: Internal Medicine

## 2019-04-23 DIAGNOSIS — Z23 Encounter for immunization: Secondary | ICD-10-CM

## 2019-04-23 NOTE — Progress Notes (Signed)
   Covid-19 Vaccination Clinic  Name:  TABATHIA KNOCHE    MRN: [id number] DOB: August 25, 1955  04/23/2019  Ms. Swearingin was observed post Covid-19 immunization for 15 minutes without incident. She was provided with Vaccine Information Sheet and instruction to access the V-Safe system.   Ms. Trinidad was instructed to call 911 with any severe reactions post vaccine: Marland Kitchen Difficulty breathing  . Swelling of face and throat  . A fast heartbeat  . A bad rash all over body  . Dizziness and weakness   Immunizations Administered    Name Date Dose VIS Date Route   Pfizer COVID-19 Vaccine 04/23/2019  9:20 AM 0.3 mL 01/11/2019 Intramuscular   Manufacturer: [employer], [employer]   Lot: [id number]   NDC: [address]-7

## 2019-05-16 DIAGNOSIS — Z6832 Body mass index (BMI) 32.0-32.9, adult: Secondary | ICD-10-CM | POA: Diagnosis not present

## 2019-05-16 DIAGNOSIS — Z124 Encounter for screening for malignant neoplasm of cervix: Secondary | ICD-10-CM | POA: Diagnosis not present

## 2019-05-16 DIAGNOSIS — Z01419 Encounter for gynecological examination (general) (routine) without abnormal findings: Secondary | ICD-10-CM | POA: Diagnosis not present

## 2019-07-12 DIAGNOSIS — Z1231 Encounter for screening mammogram for malignant neoplasm of breast: Secondary | ICD-10-CM | POA: Diagnosis not present

## 2019-10-23 DIAGNOSIS — Z23 Encounter for immunization: Secondary | ICD-10-CM | POA: Diagnosis not present

## 2019-10-23 DIAGNOSIS — J309 Allergic rhinitis, unspecified: Secondary | ICD-10-CM | POA: Diagnosis not present

## 2019-10-23 DIAGNOSIS — I10 Essential (primary) hypertension: Secondary | ICD-10-CM | POA: Diagnosis not present

## 2020-01-15 DIAGNOSIS — Z0120 Encounter for dental examination and cleaning without abnormal findings: Secondary | ICD-10-CM | POA: Diagnosis not present

## 2020-05-12 IMAGING — DX CHEST - 2 VIEW
2 series · 2 of 2 positions shown · non-contrast
Comparison: 09/10/2015

CLINICAL DATA: Chest pain

EXAM:
CHEST - 2 VIEW

[chest pa]
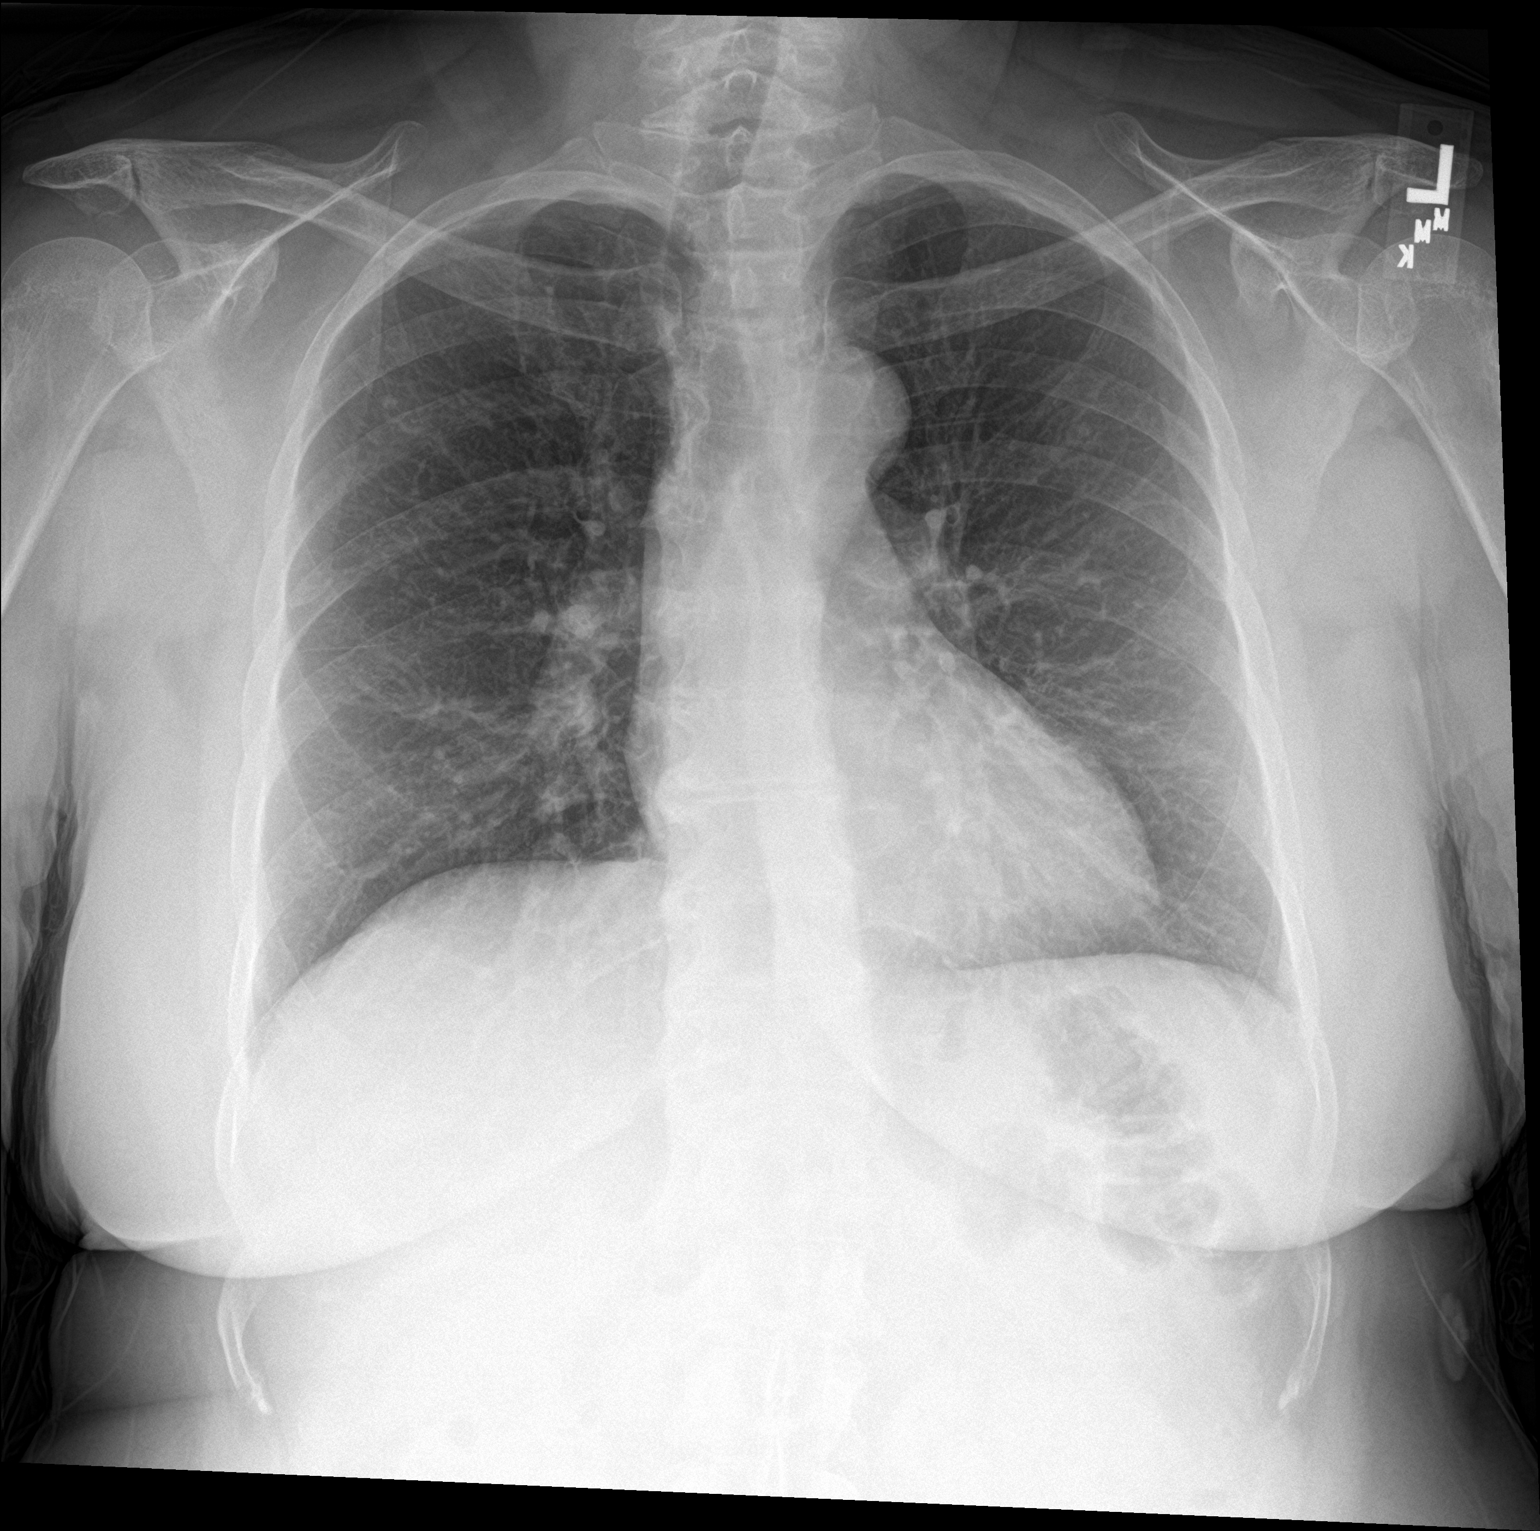

[chest lat]
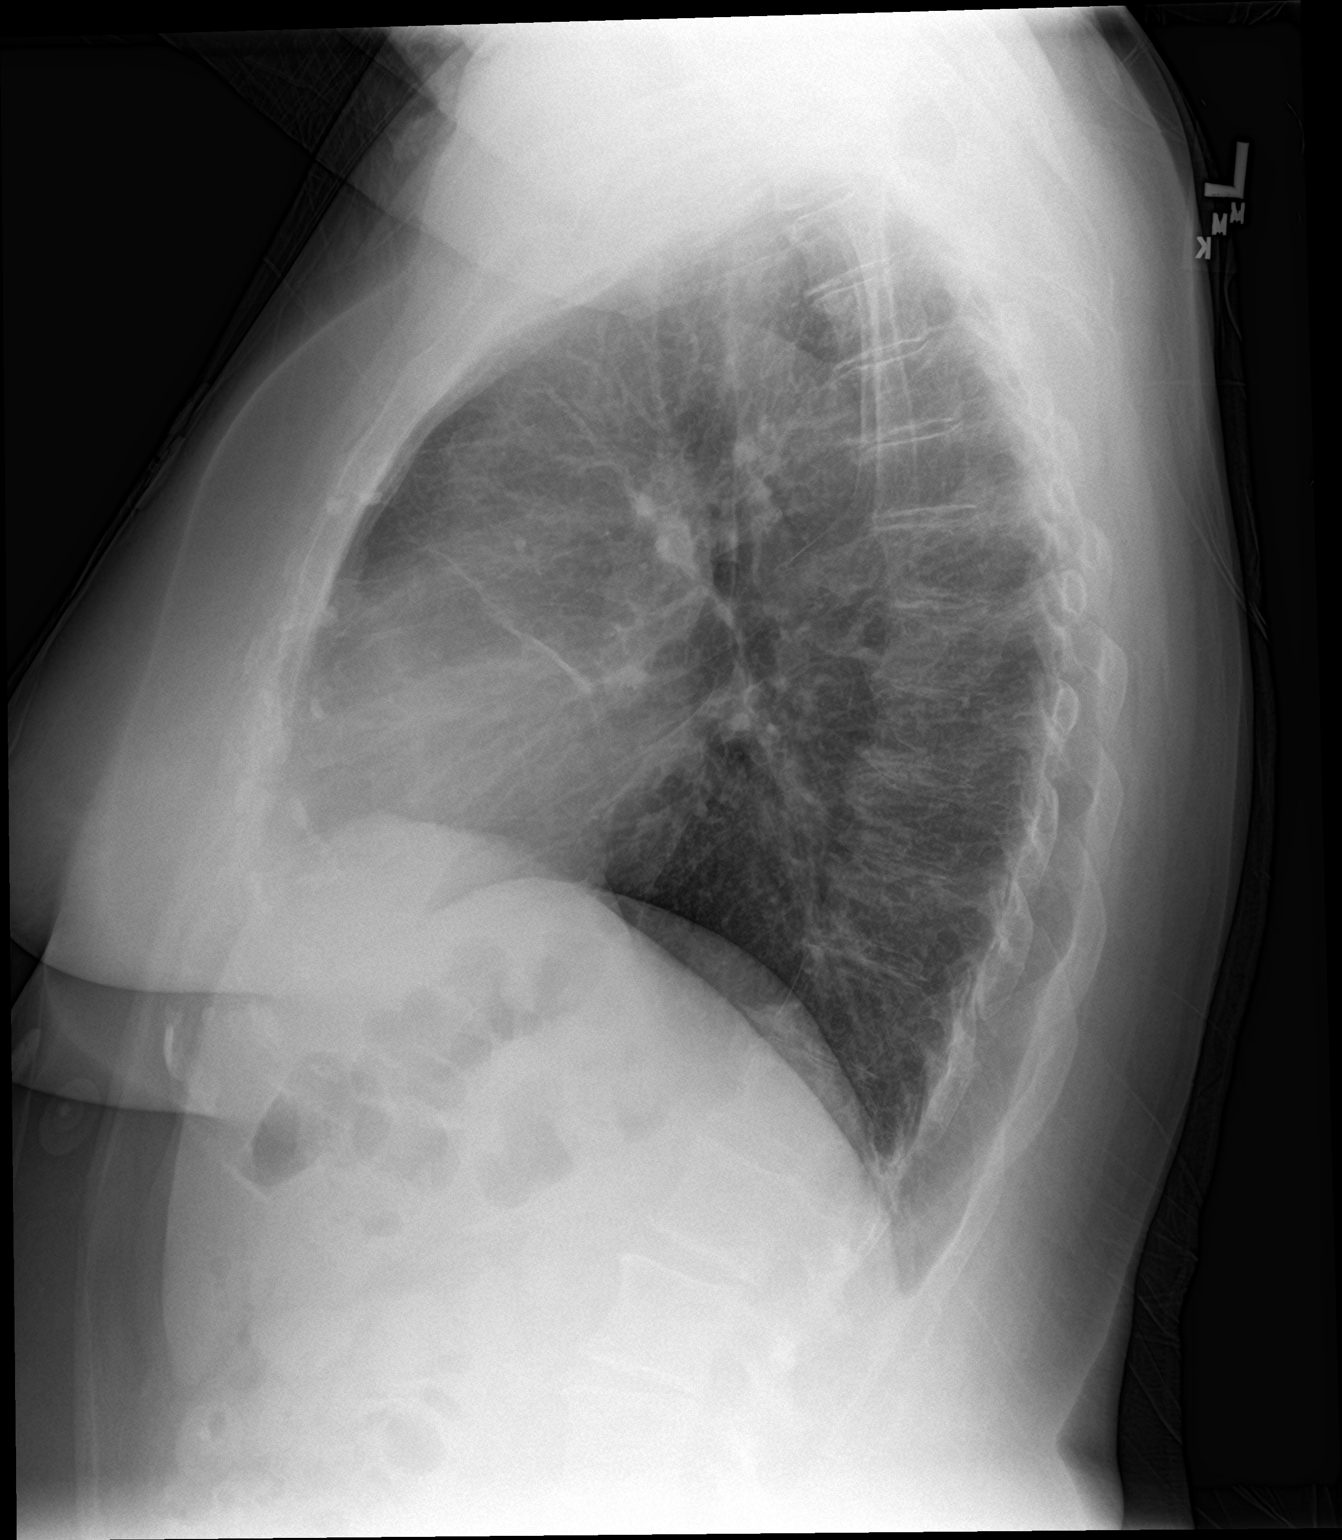

[2 of 2 positions shown; findings below may reference images not displayed]

FINDINGS: The heart size and mediastinal contours are within normal limits.
Both lungs are clear. The visualized skeletal structures are
unremarkable.
IMPRESSION: No active cardiopulmonary disease.

## 2020-07-29 DIAGNOSIS — Z1231 Encounter for screening mammogram for malignant neoplasm of breast: Secondary | ICD-10-CM | POA: Diagnosis not present

## 2020-07-29 DIAGNOSIS — Z01419 Encounter for gynecological examination (general) (routine) without abnormal findings: Secondary | ICD-10-CM | POA: Diagnosis not present

## 2020-07-29 DIAGNOSIS — Z6836 Body mass index (BMI) 36.0-36.9, adult: Secondary | ICD-10-CM | POA: Diagnosis not present

## 2020-11-23 DIAGNOSIS — Z91038 Other insect allergy status: Secondary | ICD-10-CM | POA: Diagnosis not present

## 2020-11-23 DIAGNOSIS — I10 Essential (primary) hypertension: Secondary | ICD-10-CM | POA: Diagnosis not present

## 2020-11-23 DIAGNOSIS — Z23 Encounter for immunization: Secondary | ICD-10-CM | POA: Diagnosis not present

## 2020-11-23 DIAGNOSIS — R011 Cardiac murmur, unspecified: Secondary | ICD-10-CM | POA: Diagnosis not present

## 2020-12-09 DIAGNOSIS — R011 Cardiac murmur, unspecified: Secondary | ICD-10-CM | POA: Diagnosis not present

## 2020-12-16 DIAGNOSIS — M545 Low back pain, unspecified: Secondary | ICD-10-CM | POA: Diagnosis not present

## 2021-05-05 DIAGNOSIS — I10 Essential (primary) hypertension: Secondary | ICD-10-CM | POA: Diagnosis not present

## 2021-05-05 DIAGNOSIS — Z0000 Encounter for general adult medical examination without abnormal findings: Secondary | ICD-10-CM | POA: Diagnosis not present

## 2021-05-05 DIAGNOSIS — Z23 Encounter for immunization: Secondary | ICD-10-CM | POA: Diagnosis not present

## 2021-05-05 DIAGNOSIS — Z91038 Other insect allergy status: Secondary | ICD-10-CM | POA: Diagnosis not present

## 2021-05-12 DIAGNOSIS — H524 Presbyopia: Secondary | ICD-10-CM | POA: Diagnosis not present

## 2021-09-27 DIAGNOSIS — Z1231 Encounter for screening mammogram for malignant neoplasm of breast: Secondary | ICD-10-CM | POA: Diagnosis not present

## 2021-09-27 DIAGNOSIS — Z6838 Body mass index (BMI) 38.0-38.9, adult: Secondary | ICD-10-CM | POA: Diagnosis not present

## 2021-09-27 DIAGNOSIS — Z124 Encounter for screening for malignant neoplasm of cervix: Secondary | ICD-10-CM | POA: Diagnosis not present

## 2021-09-27 DIAGNOSIS — Z01419 Encounter for gynecological examination (general) (routine) without abnormal findings: Secondary | ICD-10-CM | POA: Diagnosis not present

## 2021-11-05 DIAGNOSIS — Z1211 Encounter for screening for malignant neoplasm of colon: Secondary | ICD-10-CM | POA: Diagnosis not present

## 2021-11-05 DIAGNOSIS — I10 Essential (primary) hypertension: Secondary | ICD-10-CM | POA: Diagnosis not present

## 2021-11-05 DIAGNOSIS — Z23 Encounter for immunization: Secondary | ICD-10-CM | POA: Diagnosis not present

## 2021-11-05 DIAGNOSIS — J309 Allergic rhinitis, unspecified: Secondary | ICD-10-CM | POA: Diagnosis not present

## 2021-11-14 DIAGNOSIS — R112 Nausea with vomiting, unspecified: Secondary | ICD-10-CM | POA: Diagnosis not present

## 2021-11-14 DIAGNOSIS — U071 COVID-19: Secondary | ICD-10-CM | POA: Diagnosis not present

## 2021-11-22 DIAGNOSIS — I10 Essential (primary) hypertension: Secondary | ICD-10-CM | POA: Diagnosis not present

## 2021-11-22 DIAGNOSIS — U071 COVID-19: Secondary | ICD-10-CM | POA: Diagnosis not present

## 2021-11-22 DIAGNOSIS — I959 Hypotension, unspecified: Secondary | ICD-10-CM | POA: Diagnosis not present

## 2021-11-22 DIAGNOSIS — E860 Dehydration: Secondary | ICD-10-CM | POA: Diagnosis not present

## 2022-01-06 DIAGNOSIS — R5383 Other fatigue: Secondary | ICD-10-CM | POA: Diagnosis not present

## 2022-01-06 DIAGNOSIS — I10 Essential (primary) hypertension: Secondary | ICD-10-CM | POA: Diagnosis not present

## 2022-01-06 DIAGNOSIS — R7301 Impaired fasting glucose: Secondary | ICD-10-CM | POA: Diagnosis not present

## 2022-01-12 DIAGNOSIS — J0140 Acute pansinusitis, unspecified: Secondary | ICD-10-CM | POA: Diagnosis not present

## 2022-01-15 ENCOUNTER — Other Ambulatory Visit: Payer: Self-pay

## 2022-01-15 ENCOUNTER — Emergency Department (HOSPITAL_COMMUNITY): Payer: Medicare Other

## 2022-01-15 ENCOUNTER — Encounter (HOSPITAL_COMMUNITY): Payer: Self-pay

## 2022-01-15 ENCOUNTER — Emergency Department (HOSPITAL_COMMUNITY)
Admission: EM | Admit: 2022-01-15 | Discharge: 2022-01-16 | Disposition: A | Discharge status: Home / Self Care | Payer: Medicare Other | Attending: Emergency Medicine | Admitting: Emergency Medicine

## 2022-01-15 DIAGNOSIS — R519 Headache, unspecified: Secondary | ICD-10-CM | POA: Diagnosis not present

## 2022-01-15 DIAGNOSIS — I10 Essential (primary) hypertension: Secondary | ICD-10-CM | POA: Diagnosis not present

## 2022-01-15 DIAGNOSIS — R059 Cough, unspecified: Secondary | ICD-10-CM | POA: Diagnosis not present

## 2022-01-15 HISTORY — DX: COVID-19: U07.1

## 2022-01-15 LAB — BASIC METABOLIC PANEL
Anion gap: 10 (ref 5–15)
BUN: <5 mg/dL — ABNORMAL LOW (ref 8–23)
CO2: 28 mmol/L (ref 22–32)
Calcium: 9.2 mg/dL (ref 8.9–10.3)
Chloride: 91 mmol/L — ABNORMAL LOW (ref 98–111)
Creatinine, Ser: 0.57 mg/dL (ref 0.44–1.00)
GFR, Estimated: >60 mL/min (ref >60)
Glucose, Bld: 121 mg/dL — ABNORMAL HIGH (ref 70–99)
Potassium: 3.9 mmol/L (ref 3.5–5.1)
Sodium: 129 mmol/L — ABNORMAL LOW (ref 135–145)

## 2022-01-15 NOTE — ED Provider Triage Note (Signed)
Emergency Medicine Provider Triage Evaluation Note  Tiffany Soto , a 66 y.o. female  was evaluated in triage.  Pt complains of high blood pressure and "high blood pressure brain."  Patient reports blood pressure readings over the past couple days in the 160 systolic.  She reports having lightheaded type sensation but has difficulty describing exactly what sensation is.  Denies room spinning type sensation.  Denies visual disturbance, gait abnormalities, weakness/sensory deficits, slurring speech, facial droop.  Denies any chest pain, shortness of breath.  Patient requesting imaging of chest given that she has had chronic cough for the past 6 to 8 weeks after COVID infection as well as imaging of the head.  Review of Systems  Positive: See above Negative:   Physical Exam  BP (!) 153/83   Pulse 83   Temp 98.4 F (36.9 C)   Resp 14   Ht 5\' 3"  (1.6 m)   Wt 86.2 kg   SpO2 98%   BMI 33.66 kg/m  Gen:   Awake, no distress   Resp:  Normal effort  MSK:   Moves extremities without difficulty  Other:  Cranial nerves II through XII grossly intact.  Patient ambulated in room without difficulty.  Medical Decision Making  Medically screening exam initiated at 3:52 PM.  Appropriate orders placed.  TAKAYA HYSLOP was informed that the remainder of the evaluation will be completed by another provider, this initial triage assessment does not replace that evaluation, and the importance of remaining in the ED until their evaluation is complete.     Joylene Igo, Peter Garter 01/15/22 1605

## 2022-01-15 NOTE — ED Triage Notes (Signed)
Pt arrived POV w/ c/o yesterday about 2030 "something went on with my head", dizzy, lightheaded. Pt reports her BP was high and hasn't gone down to normal today. Pt reports BP last night was 160/89. Pt reports not feeling good since Covid in October and her BP meds have been adjusted. Also reports taking augmentin for sinus infection.

## 2022-01-16 DIAGNOSIS — I10 Essential (primary) hypertension: Secondary | ICD-10-CM | POA: Diagnosis not present

## 2022-01-16 MED ORDER — HYDROCODONE-ACETAMINOPHEN 5-325 MG PO TABS
1.0000 | ORAL_TABLET | Freq: Once | ORAL | Status: AC
  Administered 2022-01-16: 1 via ORAL
  Filled 2022-01-16: qty 1

## 2022-01-16 MED ORDER — IBUPROFEN 400 MG PO TABS
400.0000 mg | ORAL_TABLET | Freq: Once | ORAL | Status: AC
  Administered 2022-01-16: 400 mg via ORAL
  Filled 2022-01-16: qty 1

## 2022-01-16 NOTE — ED Provider Notes (Signed)
[facility] EMERGENCY DEPARTMENT Provider Note   CSN: [id number] Arrival date & time: 01/15/22  1431     History  No chief complaint on file.   Tiffany Soto is a 66 y.o. female.  Patient presents to the emergency department for evaluation of multiple problems.  Patient has been experiencing headache for 2 days.  It is described as a pressure on the brain.  She has been checking her blood pressure and it has been running high, around 160 systolic.  Patient reports that she had COVID infection couple of months ago.  Since that time her blood pressure has been hard to manage.  Sometimes it runs high, sometimes it goes low.  Her doctor has been changing her meds.  Patient reports persistent cough since her COVID diagnosis.  Patient reports that her doctor placed her on Augmentin several days ago for her sinuses.  Her sinus congestion has improved.  She is not taking any over-the-counter cold medicines or decongestants.       Home Medications Prior to Admission medications   Medication Sig Start Date End Date Taking? Authorizing Provider  EPINEPHrine 0.3 mg/0.3 mL IJ SOAJ injection Use as directed for severe allergic reaction Patient not taking: Reported on 05/19/2018 05/29/17   Bobbitt, Heywood Iles, MD  lisinopril-hydrochlorothiazide (PRINZIDE,ZESTORETIC) 20-12.5 MG tablet Take 0.5 tablets by mouth daily.  08/11/15   [provider]  PROAIR HFA 108 (90 Base) MCG/ACT inhaler INHALE 1 TO 2 PUFFS INTO THE LUNGS EVERY 6 HOURS AS NEEDED FOR WHEEZING OR SHORTNESS OF BREATH Patient not taking: Reported on 05/19/2018 06/20/17   Bobbitt, Heywood Iles, MD      Allergies    Patient has no known allergies.    Review of Systems   Review of Systems  Physical Exam Updated Vital Signs BP (!) 147/72 (BP Location: Left Arm)   Pulse 76   Temp 98 F (36.7 C) (Oral)   Resp 16   Ht 5\' 3"  (1.6 m)   Wt 86.2 kg   SpO2 96%   BMI 33.66 kg/m  Physical Exam Vitals and  nursing note reviewed.  Constitutional:      General: She is not in acute distress.    Appearance: She is well-developed.  HENT:     Head: Normocephalic and atraumatic.     Mouth/Throat:     Mouth: Mucous membranes are moist.  Eyes:     General: Vision grossly intact. Gaze aligned appropriately.     Extraocular Movements: Extraocular movements intact.     Conjunctiva/sclera: Conjunctivae normal.  Cardiovascular:     Rate and Rhythm: Normal rate and regular rhythm.     Pulses: Normal pulses.     Heart sounds: Normal heart sounds, S1 normal and S2 normal. No murmur heard.    No friction rub. No gallop.  Pulmonary:     Effort: Pulmonary effort is normal. No respiratory distress.     Breath sounds: Normal breath sounds.  Abdominal:     General: Bowel sounds are normal.     Palpations: Abdomen is soft.     Tenderness: There is no abdominal tenderness. There is no guarding or rebound.     Hernia: No hernia is present.  Musculoskeletal:        General: No swelling.     Cervical back: Full passive range of motion without pain, normal range of motion and neck supple. No spinous process tenderness or muscular tenderness. Normal range of motion.     Right  lower leg: No edema.     Left lower leg: No edema.  Skin:    General: Skin is warm and dry.     Capillary Refill: Capillary refill takes less than 2 seconds.     Findings: No ecchymosis, erythema, rash or wound.  Neurological:     General: No focal deficit present.     Mental Status: She is alert and oriented to person, place, and time.     GCS: GCS eye subscore is 4. GCS verbal subscore is 5. GCS motor subscore is 6.     Cranial Nerves: Cranial nerves 2-12 are intact.     Sensory: Sensation is intact.     Motor: Motor function is intact.     Coordination: Coordination is intact.     Comments: Extraocular muscle movement: normal No visual field cut Pupils: equal and reactive both direct and consensual response is normal No  nystagmus present    Sensory function is intact to light touch, pinprick Proprioception intact  Grip strength 5/5 symmetric in upper extremities No pronator drift Normal finger to nose bilaterally  Lower extremity strength 5/5 against gravity Normal heel to shin bilaterally  Gait: normal   Psychiatric:        Attention and Perception: Attention normal.        Mood and Affect: Mood normal.        Speech: Speech normal.        Behavior: Behavior normal.     ED Results / Procedures / Treatments   Labs (all labs ordered are listed, but only abnormal results are displayed) Labs Reviewed  BASIC METABOLIC PANEL - Abnormal; Notable for the following components:      Result Value   Sodium 129 (*)    Chloride 91 (*)    Glucose, Bld 121 (*)    BUN <5 (*)    All other components within normal limits    EKG None  Radiology CT Head Wo Contrast  Result Date: 01/15/2022 CLINICAL DATA:  Headache. EXAM: CT HEAD WITHOUT CONTRAST TECHNIQUE: Contiguous axial images were obtained from the base of the skull through the vertex without intravenous contrast. RADIATION DOSE REDUCTION: This exam was performed according to the departmental dose-optimization program which includes automated exposure control, adjustment of the mA and/or kV according to patient size and/or use of iterative reconstruction technique. COMPARISON:  None Available. FINDINGS: Brain: No evidence of acute infarction, hemorrhage, hydrocephalus, extra-axial collection or mass lesion/mass effect. Vascular: No hyperdense vessel or unexpected calcification. Skull: Normal. Negative for fracture or focal lesion. Sinuses/Orbits: Mucosal thickening in the right maxillary sinus. Other: None. IMPRESSION: 1. No acute intracranial abnormalities. 2. Mucosal thickening in the right maxillary sinus. Electronically Signed   By: Gerome Sam III M.D.   On: 01/15/2022 17:19   DG Chest 2 View  Result Date: 01/15/2022 CLINICAL DATA:  Cough  EXAM: CHEST - 2 VIEW COMPARISON:  Chest x-ray May 18, 2018 FINDINGS: The cardiomediastinal silhouette is unchanged in contour. No focal pulmonary opacity. No pleural effusion or pneumothorax. The visualized upper abdomen is unremarkable. No acute osseous abnormality. IMPRESSION: No acute cardiopulmonary abnormality. Electronically Signed   By: Jacob Moores M.D.   On: 01/15/2022 16:15    Procedures Procedures    Medications Ordered in ED Medications  HYDROcodone-acetaminophen (NORCO/VICODIN) 5-325 MG per tablet 1 tablet (has no administration in time range)  ibuprofen (ADVIL) tablet 400 mg (has no administration in time range)    ED Course/ Medical Decision Making/ A&P  Medical Decision Making  Patient appears well.  She has a normal neurologic exam.  Blood pressure has come down, mostly self corrected.  She and her husband tell me that she has been having trouble managing her blood pressures over the last couple of months as it has been labile.  I therefore caution against adding increased blood pressure treatment at this time, as this could result in periods of hypotension and even syncope.  Her CT head was unremarkable.  As her exam is nonfocal, no further intervention is necessary currently.  Headache is likely multifactorial.  Seem to get a little better as her blood pressure came down but did not completely go away.  There are no red flags or warning signs with this headache.  Will treat with analgesia, follow-up with PCP for repeat blood pressure checks.        Final Clinical Impression(s) / ED Diagnoses Final diagnoses:  Primary hypertension  Bad headache    Rx / DC Orders ED Discharge Orders     None         Trenity Pha, Canary Brim, MD 01/16/22 [phone]

## 2022-01-16 NOTE — ED Notes (Signed)
The pt is c/o a  headache and high bp whem she arrived earlier today  headache is better bp has lowered

## 2022-01-18 DIAGNOSIS — H26493 Other secondary cataract, bilateral: Secondary | ICD-10-CM | POA: Diagnosis not present

## 2022-01-18 DIAGNOSIS — Z961 Presence of intraocular lens: Secondary | ICD-10-CM | POA: Diagnosis not present

## 2022-01-18 DIAGNOSIS — H538 Other visual disturbances: Secondary | ICD-10-CM | POA: Diagnosis not present

## 2022-01-20 DIAGNOSIS — I10 Essential (primary) hypertension: Secondary | ICD-10-CM | POA: Diagnosis not present

## 2022-02-03 DIAGNOSIS — I10 Essential (primary) hypertension: Secondary | ICD-10-CM | POA: Diagnosis not present

## 2022-02-03 DIAGNOSIS — R7303 Prediabetes: Secondary | ICD-10-CM | POA: Diagnosis not present

## 2022-02-11 DIAGNOSIS — H26492 Other secondary cataract, left eye: Secondary | ICD-10-CM | POA: Diagnosis not present

## 2022-02-11 DIAGNOSIS — H26493 Other secondary cataract, bilateral: Secondary | ICD-10-CM | POA: Diagnosis not present

## 2022-02-15 DIAGNOSIS — M62830 Muscle spasm of back: Secondary | ICD-10-CM | POA: Diagnosis not present

## 2022-02-17 DIAGNOSIS — H524 Presbyopia: Secondary | ICD-10-CM | POA: Diagnosis not present

## 2022-02-25 DIAGNOSIS — H26491 Other secondary cataract, right eye: Secondary | ICD-10-CM | POA: Diagnosis not present

## 2022-03-03 DIAGNOSIS — H26491 Other secondary cataract, right eye: Secondary | ICD-10-CM | POA: Diagnosis not present

## 2022-03-03 DIAGNOSIS — H524 Presbyopia: Secondary | ICD-10-CM | POA: Diagnosis not present

## 2022-03-06 DIAGNOSIS — J069 Acute upper respiratory infection, unspecified: Secondary | ICD-10-CM | POA: Diagnosis not present

## 2022-03-08 DIAGNOSIS — J40 Bronchitis, not specified as acute or chronic: Secondary | ICD-10-CM | POA: Diagnosis not present

## 2022-03-08 DIAGNOSIS — J309 Allergic rhinitis, unspecified: Secondary | ICD-10-CM | POA: Diagnosis not present

## 2022-03-17 DIAGNOSIS — J329 Chronic sinusitis, unspecified: Secondary | ICD-10-CM | POA: Diagnosis not present

## 2022-04-24 DIAGNOSIS — M7022 Olecranon bursitis, left elbow: Secondary | ICD-10-CM | POA: Diagnosis not present

## 2022-05-10 DIAGNOSIS — I10 Essential (primary) hypertension: Secondary | ICD-10-CM | POA: Diagnosis not present

## 2022-05-10 DIAGNOSIS — Z13220 Encounter for screening for lipoid disorders: Secondary | ICD-10-CM | POA: Diagnosis not present

## 2022-05-10 DIAGNOSIS — Z0000 Encounter for general adult medical examination without abnormal findings: Secondary | ICD-10-CM | POA: Diagnosis not present

## 2022-05-10 DIAGNOSIS — R7303 Prediabetes: Secondary | ICD-10-CM | POA: Diagnosis not present

## 2022-08-01 DIAGNOSIS — M8588 Other specified disorders of bone density and structure, other site: Secondary | ICD-10-CM | POA: Diagnosis not present

## 2022-10-14 DIAGNOSIS — Z01419 Encounter for gynecological examination (general) (routine) without abnormal findings: Secondary | ICD-10-CM | POA: Diagnosis not present

## 2022-10-14 DIAGNOSIS — Z1231 Encounter for screening mammogram for malignant neoplasm of breast: Secondary | ICD-10-CM | POA: Diagnosis not present

## 2022-10-14 DIAGNOSIS — Z6838 Body mass index (BMI) 38.0-38.9, adult: Secondary | ICD-10-CM | POA: Diagnosis not present

## 2022-11-11 DIAGNOSIS — R5383 Other fatigue: Secondary | ICD-10-CM | POA: Diagnosis not present

## 2022-11-11 DIAGNOSIS — I10 Essential (primary) hypertension: Secondary | ICD-10-CM | POA: Diagnosis not present

## 2022-11-11 DIAGNOSIS — R7303 Prediabetes: Secondary | ICD-10-CM | POA: Diagnosis not present

## 2022-11-11 DIAGNOSIS — R635 Abnormal weight gain: Secondary | ICD-10-CM | POA: Diagnosis not present

## 2022-11-29 DIAGNOSIS — Z1211 Encounter for screening for malignant neoplasm of colon: Secondary | ICD-10-CM | POA: Diagnosis not present

## 2022-12-19 DIAGNOSIS — M25552 Pain in left hip: Secondary | ICD-10-CM | POA: Diagnosis not present

## 2022-12-19 DIAGNOSIS — M545 Low back pain, unspecified: Secondary | ICD-10-CM | POA: Diagnosis not present

## 2023-05-15 DIAGNOSIS — I10 Essential (primary) hypertension: Secondary | ICD-10-CM | POA: Diagnosis not present

## 2023-05-15 DIAGNOSIS — M62830 Muscle spasm of back: Secondary | ICD-10-CM | POA: Diagnosis not present

## 2023-05-15 DIAGNOSIS — Z0000 Encounter for general adult medical examination without abnormal findings: Secondary | ICD-10-CM | POA: Diagnosis not present

## 2023-05-15 DIAGNOSIS — M8580 Other specified disorders of bone density and structure, unspecified site: Secondary | ICD-10-CM | POA: Diagnosis not present

## 2023-05-15 DIAGNOSIS — R7303 Prediabetes: Secondary | ICD-10-CM | POA: Diagnosis not present

## 2023-05-15 DIAGNOSIS — Z136 Encounter for screening for cardiovascular disorders: Secondary | ICD-10-CM | POA: Diagnosis not present

## 2023-11-07 DIAGNOSIS — Z6839 Body mass index (BMI) 39.0-39.9, adult: Secondary | ICD-10-CM | POA: Diagnosis not present

## 2023-11-07 DIAGNOSIS — Z1231 Encounter for screening mammogram for malignant neoplasm of breast: Secondary | ICD-10-CM | POA: Diagnosis not present

## 2023-11-07 DIAGNOSIS — Z124 Encounter for screening for malignant neoplasm of cervix: Secondary | ICD-10-CM | POA: Diagnosis not present

## 2023-11-07 DIAGNOSIS — Z01419 Encounter for gynecological examination (general) (routine) without abnormal findings: Secondary | ICD-10-CM | POA: Diagnosis not present

## 2023-11-14 DIAGNOSIS — I10 Essential (primary) hypertension: Secondary | ICD-10-CM | POA: Diagnosis not present

## 2023-11-14 DIAGNOSIS — Z23 Encounter for immunization: Secondary | ICD-10-CM | POA: Diagnosis not present

## 2023-11-14 DIAGNOSIS — R7303 Prediabetes: Secondary | ICD-10-CM | POA: Diagnosis not present

## 2023-11-21 DIAGNOSIS — H5203 Hypermetropia, bilateral: Secondary | ICD-10-CM | POA: Diagnosis not present

## 2023-11-21 DIAGNOSIS — I10 Essential (primary) hypertension: Secondary | ICD-10-CM | POA: Diagnosis not present

## 2023-11-21 DIAGNOSIS — Z6839 Body mass index (BMI) 39.0-39.9, adult: Secondary | ICD-10-CM | POA: Diagnosis not present
